# Patient Record
Sex: Female | Born: 1935 | Race: White | Hispanic: No | Marital: Married | Smoking: Never smoker
Health system: Southern US, Community
[De-identification: ages and names within clinical notes are randomized; demographics above are authoritative.]

## PROBLEM LIST (undated history)

## (undated) ENCOUNTER — Emergency Department (HOSPITAL_COMMUNITY): Admission: EM | Payer: Self-pay

## (undated) DIAGNOSIS — C801 Malignant (primary) neoplasm, unspecified: Secondary | ICD-10-CM

## (undated) DIAGNOSIS — I10 Essential (primary) hypertension: Secondary | ICD-10-CM

---

## 2015-08-18 ENCOUNTER — Encounter (HOSPITAL_COMMUNITY): Payer: Self-pay | Admitting: *Deleted

## 2015-08-18 ENCOUNTER — Emergency Department (HOSPITAL_COMMUNITY): Payer: Medicare Other

## 2015-08-18 ENCOUNTER — Observation Stay (HOSPITAL_COMMUNITY)
Admission: EM | Admit: 2015-08-18 | Discharge: 2015-08-21 | Disposition: A | Discharge status: Home / Self Care | Payer: Medicare Other | Attending: General Surgery | Admitting: General Surgery

## 2015-08-18 DIAGNOSIS — Z9012 Acquired absence of left breast and nipple: Secondary | ICD-10-CM | POA: Insufficient documentation

## 2015-08-18 DIAGNOSIS — S025XXA Fracture of tooth (traumatic), initial encounter for closed fracture: Secondary | ICD-10-CM | POA: Diagnosis not present

## 2015-08-18 DIAGNOSIS — R0902 Hypoxemia: Secondary | ICD-10-CM | POA: Diagnosis not present

## 2015-08-18 DIAGNOSIS — S42112A Displaced fracture of body of scapula, left shoulder, initial encounter for closed fracture: Secondary | ICD-10-CM | POA: Insufficient documentation

## 2015-08-18 DIAGNOSIS — S0003XA Contusion of scalp, initial encounter: Secondary | ICD-10-CM | POA: Diagnosis not present

## 2015-08-18 DIAGNOSIS — J939 Pneumothorax, unspecified: Secondary | ICD-10-CM

## 2015-08-18 DIAGNOSIS — S301XXA Contusion of abdominal wall, initial encounter: Secondary | ICD-10-CM | POA: Insufficient documentation

## 2015-08-18 DIAGNOSIS — W109XXA Fall (on) (from) unspecified stairs and steps, initial encounter: Secondary | ICD-10-CM | POA: Insufficient documentation

## 2015-08-18 DIAGNOSIS — N179 Acute kidney failure, unspecified: Secondary | ICD-10-CM | POA: Insufficient documentation

## 2015-08-18 DIAGNOSIS — S27321A Contusion of lung, unilateral, initial encounter: Secondary | ICD-10-CM | POA: Diagnosis not present

## 2015-08-18 DIAGNOSIS — S2249XA Multiple fractures of ribs, unspecified side, initial encounter for closed fracture: Secondary | ICD-10-CM | POA: Diagnosis present

## 2015-08-18 DIAGNOSIS — I10 Essential (primary) hypertension: Secondary | ICD-10-CM | POA: Diagnosis not present

## 2015-08-18 DIAGNOSIS — R531 Weakness: Secondary | ICD-10-CM | POA: Insufficient documentation

## 2015-08-18 DIAGNOSIS — S270XXA Traumatic pneumothorax, initial encounter: Secondary | ICD-10-CM | POA: Insufficient documentation

## 2015-08-18 DIAGNOSIS — Z7982 Long term (current) use of aspirin: Secondary | ICD-10-CM | POA: Diagnosis not present

## 2015-08-18 DIAGNOSIS — S42102A Fracture of unspecified part of scapula, left shoulder, initial encounter for closed fracture: Secondary | ICD-10-CM

## 2015-08-18 DIAGNOSIS — S2242XA Multiple fractures of ribs, left side, initial encounter for closed fracture: Principal | ICD-10-CM | POA: Insufficient documentation

## 2015-08-18 DIAGNOSIS — W19XXXA Unspecified fall, initial encounter: Secondary | ICD-10-CM

## 2015-08-18 DIAGNOSIS — S2232XA Fracture of one rib, left side, initial encounter for closed fracture: Secondary | ICD-10-CM

## 2015-08-18 DIAGNOSIS — S02401A Maxillary fracture, unspecified, initial encounter for closed fracture: Secondary | ICD-10-CM

## 2015-08-18 DIAGNOSIS — S22009A Unspecified fracture of unspecified thoracic vertebra, initial encounter for closed fracture: Secondary | ICD-10-CM

## 2015-08-18 HISTORY — DX: Malignant (primary) neoplasm, unspecified: C80.1

## 2015-08-18 HISTORY — DX: Essential (primary) hypertension: I10

## 2015-08-18 LAB — I-STAT CHEM 8, ED
BUN: 34 mg/dL — AB (ref 6–20)
CREATININE: 1.2 mg/dL — AB (ref 0.44–1.00)
Calcium, Ion: 1.08 mmol/L — ABNORMAL LOW (ref 1.13–1.30)
Chloride: 103 mmol/L (ref 101–111)
Glucose, Bld: 131 mg/dL — ABNORMAL HIGH (ref 65–99)
HEMATOCRIT: 38 % (ref 36.0–46.0)
Hemoglobin: 12.9 g/dL (ref 12.0–15.0)
POTASSIUM: 4.1 mmol/L (ref 3.5–5.1)
Sodium: 142 mmol/L (ref 135–145)
TCO2: 28 mmol/L (ref 0–100)

## 2015-08-18 LAB — CBC WITH DIFFERENTIAL/PLATELET
BASOS ABS: 0 10*3/uL (ref 0.0–0.1)
BASOS PCT: 0 %
Eosinophils Absolute: 0.2 10*3/uL (ref 0.0–0.7)
Eosinophils Relative: 1 %
HEMATOCRIT: 38.2 % (ref 36.0–46.0)
HEMOGLOBIN: 12.5 g/dL (ref 12.0–15.0)
Lymphocytes Relative: 23 %
Lymphs Abs: 3.7 10*3/uL (ref 0.7–4.0)
MCH: 30.8 pg (ref 26.0–34.0)
MCHC: 32.7 g/dL (ref 30.0–36.0)
MCV: 94.1 fL (ref 78.0–100.0)
Monocytes Absolute: 0.7 10*3/uL (ref 0.1–1.0)
Monocytes Relative: 5 %
NEUTROS ABS: 11.2 10*3/uL — AB (ref 1.7–7.7)
NEUTROS PCT: 71 %
Platelets: 205 10*3/uL (ref 150–400)
RBC: 4.06 MIL/uL (ref 3.87–5.11)
RDW: 13.8 % (ref 11.5–15.5)
WBC: 15.9 10*3/uL — ABNORMAL HIGH (ref 4.0–10.5)

## 2015-08-18 LAB — PROTIME-INR
INR: 1.06 (ref 0.00–1.49)
Prothrombin Time: 14 seconds (ref 11.6–15.2)

## 2015-08-18 MED ORDER — SIMVASTATIN 20 MG PO TABS
20.0000 mg | ORAL_TABLET | Freq: Every day | ORAL | Status: DC
  Administered 2015-08-18 – 2015-08-21 (×4): 20 mg via ORAL
  Filled 2015-08-18 (×4): qty 1

## 2015-08-18 MED ORDER — IOPAMIDOL (ISOVUE-300) INJECTION 61%
INTRAVENOUS | Status: AC
  Administered 2015-08-18: 100 mL
  Filled 2015-08-18: qty 100

## 2015-08-18 MED ORDER — FENTANYL CITRATE (PF) 100 MCG/2ML IJ SOLN
50.0000 ug | Freq: Once | INTRAMUSCULAR | Status: AC
  Administered 2015-08-18: 50 ug via INTRAVENOUS
  Filled 2015-08-18: qty 2

## 2015-08-18 MED ORDER — METHOCARBAMOL 500 MG PO TABS
500.0000 mg | ORAL_TABLET | Freq: Four times a day (QID) | ORAL | Status: DC
  Administered 2015-08-18: 500 mg via ORAL
  Filled 2015-08-18 (×3): qty 1

## 2015-08-18 MED ORDER — FUROSEMIDE 20 MG PO TABS
40.0000 mg | ORAL_TABLET | Freq: Every day | ORAL | Status: DC
  Administered 2015-08-18 – 2015-08-21 (×4): 40 mg via ORAL
  Filled 2015-08-18 (×4): qty 2

## 2015-08-18 MED ORDER — ENOXAPARIN SODIUM 30 MG/0.3ML ~~LOC~~ SOLN
30.0000 mg | SUBCUTANEOUS | Status: DC
  Administered 2015-08-18 – 2015-08-21 (×4): 30 mg via SUBCUTANEOUS
  Filled 2015-08-18 (×4): qty 0.3

## 2015-08-18 MED ORDER — PROMETHAZINE HCL 25 MG/ML IJ SOLN
12.5000 mg | Freq: Once | INTRAMUSCULAR | Status: AC
Start: 2015-08-18 — End: 2015-08-18
  Administered 2015-08-18: 12.5 mg via INTRAVENOUS
  Filled 2015-08-18: qty 1

## 2015-08-18 MED ORDER — ONDANSETRON HCL 4 MG PO TABS: 4.0000 mg | ORAL_TABLET | Freq: Four times a day (QID) | ORAL | Status: DC | PRN

## 2015-08-18 MED ORDER — ONDANSETRON HCL 4 MG/2ML IJ SOLN
4.0000 mg | Freq: Once | INTRAMUSCULAR | Status: AC
  Administered 2015-08-18: 4 mg via INTRAVENOUS
  Filled 2015-08-18: qty 2

## 2015-08-18 MED ORDER — HYDROMORPHONE HCL 1 MG/ML IJ SOLN
0.5000 mg | INTRAMUSCULAR | Status: DC | PRN
  Administered 2015-08-18 (×2): 1 mg via INTRAVENOUS
  Filled 2015-08-18 (×2): qty 1

## 2015-08-18 MED ORDER — KCL IN DEXTROSE-NACL 20-5-0.45 MEQ/L-%-% IV SOLN
INTRAVENOUS | Status: DC
  Administered 2015-08-18 – 2015-08-19 (×2): via INTRAVENOUS
  Administered 2015-08-19 – 2015-08-20 (×2): 100 mL/h via INTRAVENOUS
  Filled 2015-08-18 (×7): qty 1000

## 2015-08-18 MED ORDER — OXYCODONE HCL 5 MG PO TABS: 5.0000 mg | ORAL_TABLET | ORAL | Status: DC | PRN

## 2015-08-18 MED ORDER — DILTIAZEM HCL ER COATED BEADS 300 MG PO CP24
300.0000 mg | ORAL_CAPSULE | Freq: Every day | ORAL | Status: DC
  Administered 2015-08-18 – 2015-08-21 (×4): 300 mg via ORAL
  Filled 2015-08-18 (×4): qty 1

## 2015-08-18 MED ORDER — ONDANSETRON HCL 4 MG/2ML IJ SOLN
4.0000 mg | Freq: Four times a day (QID) | INTRAMUSCULAR | Status: DC | PRN
  Administered 2015-08-18 – 2015-08-21 (×3): 4 mg via INTRAVENOUS
  Filled 2015-08-18 (×3): qty 2

## 2015-08-18 MED ORDER — TRAMADOL HCL 50 MG PO TABS: 50.0000 mg | ORAL_TABLET | Freq: Four times a day (QID) | ORAL | Status: DC | PRN

## 2015-08-18 MED ORDER — OXYCODONE HCL 5 MG PO TABS
2.5000 mg | ORAL_TABLET | ORAL | Status: DC | PRN
  Administered 2015-08-18: 2.5 mg via ORAL
  Filled 2015-08-18: qty 1

## 2015-08-18 NOTE — Progress Notes (Signed)
Patient vomited small amount of dark black foul smelling emesis.  She was medicated with 4mg  IV Zofran at 1912.  She feels nausea is related to pain medicine which was given at approximately 1400. Her abdomen is soft;bowel sounds active.  VSS. Paged MD.

## 2015-08-18 NOTE — H&P (Signed)
History   Yvette Kelly is an 82 y.o. female.   Chief Complaint: [place] down 10 stairs, left chest wall pain.   Chief Complaint  Patient presents with  . Fall  . level 2     Fall This is a new problem. The current episode started yesterday. The problem occurs rarely. The problem has been unchanged. Associated symptoms include chest pain (left chest wall pain.). Pertinent negatives include no chills. The symptoms are aggravated by twisting, standing and walking. She has tried position changes for the symptoms. The treatment provided mild relief.    Past Medical History  Diagnosis Date  . Hypertension     History reviewed. No pertinent past surgical history.  No family history on file. Social History:  reports that she has never smoked. She does not have any smokeless tobacco history on file. She reports that she does not drink alcohol. Her drug history is not on file.  Allergies  No Known Allergies  Home Medications   (Not in a hospital admission)  Trauma Course   Results for orders placed or performed during the hospital encounter of 08/18/15 (from the past 48 hour(s))  CBC with Differential     Status: Abnormal   Collection Time: 08/18/15  1:45 AM  Result Value Ref Range   WBC 15.9 (H) 4.0 - 10.5 K/uL   RBC 4.06 3.87 - 5.11 MIL/uL   Hemoglobin 12.5 12.0 - 15.0 g/dL   HCT 38.2 36.0 - 46.0 %   MCV 94.1 78.0 - 100.0 fL   MCH 30.8 26.0 - 34.0 pg   MCHC 32.7 30.0 - 36.0 g/dL   RDW 13.8 11.5 - 15.5 %   Platelets 205 150 - 400 K/uL   Neutrophils Relative % 71 %   Neutro Abs 11.2 (H) 1.7 - 7.7 K/uL   Lymphocytes Relative 23 %   Lymphs Abs 3.7 0.7 - 4.0 K/uL   Monocytes Relative 5 %   Monocytes Absolute 0.7 0.1 - 1.0 K/uL   Eosinophils Relative 1 %   Eosinophils Absolute 0.2 0.0 - 0.7 K/uL   Basophils Relative 0 %   Basophils Absolute 0.0 0.0 - 0.1 K/uL  Protime-INR     Status: None   Collection Time: 08/18/15  1:45 AM  Result Value Ref Range   Prothrombin Time  14.0 11.6 - 15.2 seconds   INR 1.06 0.00 - 1.49  I-stat Chem 8, ED     Status: Abnormal   Collection Time: 08/18/15  2:02 AM  Result Value Ref Range   Sodium 142 135 - 145 mmol/L   Potassium 4.1 3.5 - 5.1 mmol/L   Chloride 103 101 - 111 mmol/L   BUN 34 (H) 6 - 20 mg/dL   Creatinine, Ser 1.20 (H) 0.44 - 1.00 mg/dL   Glucose, Bld 131 (H) 65 - 99 mg/dL   Calcium, Ion 1.08 (L) 1.13 - 1.30 mmol/L   TCO2 28 0 - 100 mmol/L   Hemoglobin 12.9 12.0 - 15.0 g/dL   HCT 38.0 36.0 - 46.0 %   Ct Head Wo Contrast  08/18/2015  CLINICAL DATA:  Level 2 trauma. Patient fell down 10 steps. Concern for head, maxillofacial or cervical spine injury. Initial encounter. EXAM: CT HEAD WITHOUT CONTRAST CT MAXILLOFACIAL WITHOUT CONTRAST CT CERVICAL SPINE WITHOUT CONTRAST TECHNIQUE: Multidetector CT imaging of the head, cervical spine, and maxillofacial structures were performed using the standard protocol without intravenous contrast. Multiplanar CT image reconstructions of the cervical spine and maxillofacial structures were also generated. COMPARISON:  None. FINDINGS: CT HEAD FINDINGS There is no evidence of acute infarction, mass lesion, or intra- or extra-axial hemorrhage on CT. Scattered periventricular and subcortical white matter change likely reflects small vessel ischemic microangiopathy. Mild cerebellar atrophy is noted. A tiny focus of increased attenuation at the high left posterior parietal lobe is thought to reflect a vessel. The brainstem and fourth ventricle are within normal limits. The third and lateral ventricles, and basal ganglia are unremarkable in appearance. The cerebral hemispheres are symmetric in appearance, with normal gray-white differentiation. No mass effect or midline shift is seen. There is no evidence of fracture; visualized osseous structures are unremarkable in appearance. The orbits are within normal limits. The paranasal sinuses and mastoid air cells are well-aerated. Soft tissue swelling is  noted overlying the left posterior parietal calvarium. CT MAXILLOFACIAL FINDINGS There appears to be a small fracture extending across the anterior aspect of the roots of the left central and lateral maxillary incisors. There is no additional evidence of fracture. The mandible appears intact. The nasal bone is unremarkable in appearance. Degenerative change is noted at the temporomandibular joints bilaterally, with flattening of the mandibular condylar heads. A periapical abscess is noted at the root of the right lateral maxillary incisor. The orbits are intact bilaterally. The visualized paranasal sinuses and mastoid air cells are well-aerated. No significant soft tissue abnormalities are seen. The parapharyngeal fat planes are preserved. The nasopharynx, oropharynx and hypopharynx are unremarkable in appearance. The visualized portions of the valleculae and piriform sinuses are grossly unremarkable. The parotid and submandibular glands are within normal limits. No cervical lymphadenopathy is seen. CT CERVICAL SPINE FINDINGS There appear to be nondisplaced fractures through the left transverse processes of T2 and T3. No additional fractures are seen. There is minimal grade 1 anterolisthesis of C4 on C5. Multilevel disc space narrowing is noted along the cervical spine, with scattered anterior and posterior disc osteophyte complexes, and underlying facet disease. Vertebral bodies demonstrate normal height. Prevertebral soft tissues are within normal limits. Tiny hypodensities within the thyroid gland are likely benign, given their size. Scarring is noted at the lung apices. A tiny left apical pneumothorax is noted. Mild scattered soft tissue air is seen posterior to the left lung apex. IMPRESSION: 1. No evidence of traumatic intracranial injury. 2. Small fracture extending across the anterior aspect of the roots of the left central and lateral maxillary incisors. 3. Nondisplaced fractures through the left transverse  processes of T2 and T3. 4. Soft tissue swelling overlying the left posterior parietal calvarium. 5. Scattered small vessel ischemic microangiopathy. 6. Degenerative change at the temporomandibular joints bilaterally, with flattening of the mandibular condylar heads. 7. Periapical abscess at the root of the right lateral maxillary incisor. 8. Degenerative change along the cervical spine. 9. Scarring at the lung apices. Tiny left apical pneumothorax noted. 10. Mild scattered soft tissue air posterior to the left lung apex. These results were called by telephone at the time of interpretation on 08/18/2015 at 3:46 am to Dr. Davonna Belling, who verbally acknowledged these results. Electronically Signed   By: Garald Balding M.D.   On: 08/18/2015 03:47   Ct Chest W Contrast  08/18/2015  CLINICAL DATA:  Level 2 trauma.  Fall down steps. Initial encounter. EXAM: CT CHEST, ABDOMEN, AND PELVIS WITH CONTRAST TECHNIQUE: Multidetector CT imaging of the chest, abdomen and pelvis was performed following the standard protocol during bolus administration of intravenous contrast. CONTRAST:  173mL ISOVUE-300 IOPAMIDOL (ISOVUE-300) INJECTION 61% COMPARISON:  None. FINDINGS: CT CHEST FINDINGS  THORACIC INLET/BODY WALL: Soft tissue gas posterior to the left upper chest and around the left shoulder girdle, presumably from pulmonic air leak as no laceration is seen. Left mastectomy.  No axillary adenopathy. Sub cm right thyroid nodule. MEDIASTINUM: Mild cardiomegaly. No pericardial effusion. No mediastinal hematoma or evidence of acute vascular injury. Small sliding hiatal hernia. LUNG WINDOWS: Small left pneumothorax, less than 5%. Minor lung contusion peripherally in the left lower lobe. Patchy bilateral atelectasis. When accounting for extrapleural hemorrhage there is no hemothorax. OSSEOUS: See below CT ABDOMEN AND PELVIS FINDINGS BODY WALL: Subcutaneous contusion around the left flank. Hepatobiliary: No focal liver abnormality.No  evidence of biliary obstruction or stone. Pancreas: Unremarkable. Spleen: Unremarkable. Adrenals/Urinary Tract: Negative adrenals. No evidence of renal injury. Unremarkable bladder. Reproductive:Abnormally thickened endometrium at 22 mm. Stomach/Bowel: No evidence of injury. Distal colonic diverticulosis. Vascular/Lymphatic: No acute vascular abnormality. No mass or adenopathy. Peritoneal: No ascites or pneumoperitoneum. Musculoskeletal: Comminuted fracture of the medial scapula on the left with multiple displaced and rotated fragments. Left second through seventh rib fractures with up to moderate displacement. There is extrapleural hemorrhage and gas. Chronic appearing T6 and T9 superior endplate fractures. T10 vertebral body hemangioma. T2 and T3 left transverse process fractures, nondisplaced and better seen on cervical spine CT - included for continuity. Lumbar predominant disc degeneration and facet arthropathy. Critical Value/emergent results were called by telephone at the time of interpretation on 08/18/2015 at 4:11 am to Dr. Davonna Belling , who verbally acknowledged these results. IMPRESSION: 1. Small left pneumothorax, less than 5%. Minor left lung contusion. 2. Left second through seventh rib fractures with up to moderate displacement. 3. T2 and T3 left transverse process fractures, nondisplaced. 4. Comminuted and displaced left medial scapula fracture. 5. Left flank contusion.  No evidence of intra-abdominal injury. 6. Abnormally thickened endometrium at 22 mm, possible carcinoma. Recommend follow-up pelvic ultrasound after convalescence. Electronically Signed   By: Monte Fantasia M.D.   On: 08/18/2015 04:12   Ct Cervical Spine Wo Contrast  08/18/2015  CLINICAL DATA:  Level 2 trauma. Patient fell down 10 steps. Concern for head, maxillofacial or cervical spine injury. Initial encounter. EXAM: CT HEAD WITHOUT CONTRAST CT MAXILLOFACIAL WITHOUT CONTRAST CT CERVICAL SPINE WITHOUT CONTRAST TECHNIQUE:  Multidetector CT imaging of the head, cervical spine, and maxillofacial structures were performed using the standard protocol without intravenous contrast. Multiplanar CT image reconstructions of the cervical spine and maxillofacial structures were also generated. COMPARISON:  None. FINDINGS: CT HEAD FINDINGS There is no evidence of acute infarction, mass lesion, or intra- or extra-axial hemorrhage on CT. Scattered periventricular and subcortical white matter change likely reflects small vessel ischemic microangiopathy. Mild cerebellar atrophy is noted. A tiny focus of increased attenuation at the high left posterior parietal lobe is thought to reflect a vessel. The brainstem and fourth ventricle are within normal limits. The third and lateral ventricles, and basal ganglia are unremarkable in appearance. The cerebral hemispheres are symmetric in appearance, with normal gray-white differentiation. No mass effect or midline shift is seen. There is no evidence of fracture; visualized osseous structures are unremarkable in appearance. The orbits are within normal limits. The paranasal sinuses and mastoid air cells are well-aerated. Soft tissue swelling is noted overlying the left posterior parietal calvarium. CT MAXILLOFACIAL FINDINGS There appears to be a small fracture extending across the anterior aspect of the roots of the left central and lateral maxillary incisors. There is no additional evidence of fracture. The mandible appears intact. The nasal bone is unremarkable in appearance.  Degenerative change is noted at the temporomandibular joints bilaterally, with flattening of the mandibular condylar heads. A periapical abscess is noted at the root of the right lateral maxillary incisor. The orbits are intact bilaterally. The visualized paranasal sinuses and mastoid air cells are well-aerated. No significant soft tissue abnormalities are seen. The parapharyngeal fat planes are preserved. The nasopharynx, oropharynx  and hypopharynx are unremarkable in appearance. The visualized portions of the valleculae and piriform sinuses are grossly unremarkable. The parotid and submandibular glands are within normal limits. No cervical lymphadenopathy is seen. CT CERVICAL SPINE FINDINGS There appear to be nondisplaced fractures through the left transverse processes of T2 and T3. No additional fractures are seen. There is minimal grade 1 anterolisthesis of C4 on C5. Multilevel disc space narrowing is noted along the cervical spine, with scattered anterior and posterior disc osteophyte complexes, and underlying facet disease. Vertebral bodies demonstrate normal height. Prevertebral soft tissues are within normal limits. Tiny hypodensities within the thyroid gland are likely benign, given their size. Scarring is noted at the lung apices. A tiny left apical pneumothorax is noted. Mild scattered soft tissue air is seen posterior to the left lung apex. IMPRESSION: 1. No evidence of traumatic intracranial injury. 2. Small fracture extending across the anterior aspect of the roots of the left central and lateral maxillary incisors. 3. Nondisplaced fractures through the left transverse processes of T2 and T3. 4. Soft tissue swelling overlying the left posterior parietal calvarium. 5. Scattered small vessel ischemic microangiopathy. 6. Degenerative change at the temporomandibular joints bilaterally, with flattening of the mandibular condylar heads. 7. Periapical abscess at the root of the right lateral maxillary incisor. 8. Degenerative change along the cervical spine. 9. Scarring at the lung apices. Tiny left apical pneumothorax noted. 10. Mild scattered soft tissue air posterior to the left lung apex. These results were called by telephone at the time of interpretation on 08/18/2015 at 3:46 am to Dr. Davonna Belling, who verbally acknowledged these results. Electronically Signed   By: Garald Balding M.D.   On: 08/18/2015 03:47   Ct Abdomen Pelvis  W Contrast  08/18/2015  CLINICAL DATA:  Level 2 trauma.  Fall down steps. Initial encounter. EXAM: CT CHEST, ABDOMEN, AND PELVIS WITH CONTRAST TECHNIQUE: Multidetector CT imaging of the chest, abdomen and pelvis was performed following the standard protocol during bolus administration of intravenous contrast. CONTRAST:  178mL ISOVUE-300 IOPAMIDOL (ISOVUE-300) INJECTION 61% COMPARISON:  None. FINDINGS: CT CHEST FINDINGS THORACIC INLET/BODY WALL: Soft tissue gas posterior to the left upper chest and around the left shoulder girdle, presumably from pulmonic air leak as no laceration is seen. Left mastectomy.  No axillary adenopathy. Sub cm right thyroid nodule. MEDIASTINUM: Mild cardiomegaly. No pericardial effusion. No mediastinal hematoma or evidence of acute vascular injury. Small sliding hiatal hernia. LUNG WINDOWS: Small left pneumothorax, less than 5%. Minor lung contusion peripherally in the left lower lobe. Patchy bilateral atelectasis. When accounting for extrapleural hemorrhage there is no hemothorax. OSSEOUS: See below CT ABDOMEN AND PELVIS FINDINGS BODY WALL: Subcutaneous contusion around the left flank. Hepatobiliary: No focal liver abnormality.No evidence of biliary obstruction or stone. Pancreas: Unremarkable. Spleen: Unremarkable. Adrenals/Urinary Tract: Negative adrenals. No evidence of renal injury. Unremarkable bladder. Reproductive:Abnormally thickened endometrium at 22 mm. Stomach/Bowel: No evidence of injury. Distal colonic diverticulosis. Vascular/Lymphatic: No acute vascular abnormality. No mass or adenopathy. Peritoneal: No ascites or pneumoperitoneum. Musculoskeletal: Comminuted fracture of the medial scapula on the left with multiple displaced and rotated fragments. Left second through seventh rib fractures with  up to moderate displacement. There is extrapleural hemorrhage and gas. Chronic appearing T6 and T9 superior endplate fractures. T10 vertebral body hemangioma. T2 and T3 left  transverse process fractures, nondisplaced and better seen on cervical spine CT - included for continuity. Lumbar predominant disc degeneration and facet arthropathy. Critical Value/emergent results were called by telephone at the time of interpretation on 08/18/2015 at 4:11 am to Dr. Davonna Belling , who verbally acknowledged these results. IMPRESSION: 1. Small left pneumothorax, less than 5%. Minor left lung contusion. 2. Left second through seventh rib fractures with up to moderate displacement. 3. T2 and T3 left transverse process fractures, nondisplaced. 4. Comminuted and displaced left medial scapula fracture. 5. Left flank contusion.  No evidence of intra-abdominal injury. 6. Abnormally thickened endometrium at 22 mm, possible carcinoma. Recommend follow-up pelvic ultrasound after convalescence. Electronically Signed   By: Monte Fantasia M.D.   On: 08/18/2015 04:12   Dg Pelvis Portable  08/18/2015  CLINICAL DATA:  Status post fall, with concern for pelvic injury. Initial encounter. EXAM: PORTABLE PELVIS 1-2 VIEWS COMPARISON:  None. FINDINGS: There is no evidence of fracture or dislocation. Both femoral heads are seated normally within their respective acetabula. Mild degenerative change is noted at the lower lumbar spine. The sacroiliac joints are unremarkable in appearance. The visualized bowel gas pattern is grossly unremarkable in appearance. IMPRESSION: No evidence of fracture or dislocation. Electronically Signed   By: Garald Balding M.D.   On: 08/18/2015 02:56   Dg Chest Portable 1 View  08/18/2015  CLINICAL DATA:  Status post fall, with concern for chest injury. Initial encounter. EXAM: PORTABLE CHEST 1 VIEW COMPARISON:  None. FINDINGS: The lungs are well-aerated. A small left pleural effusion is noted. Mild vascular congestion is seen. Mild bibasilar atelectasis is noted. There is no evidence of pleural effusion or pneumothorax. The cardiomediastinal silhouette is within normal limits. No acute  osseous abnormalities are seen. Scattered clips are seen overlying the left axilla. IMPRESSION: Small left pleural effusion noted. Mild vascular congestion seen. Mild bibasilar atelectasis noted. Electronically Signed   By: Garald Balding M.D.   On: 08/18/2015 02:56   Dg Shoulder Left  08/18/2015  CLINICAL DATA:  Status post fall, with left shoulder pain. Initial encounter. EXAM: LEFT SHOULDER - 2+ VIEW COMPARISON:  None. FINDINGS: There is a displaced and angulated fracture involving the body of the scapula. Overlying soft tissue air and soft tissue hemorrhage are suggested. There appears to be extension into the superior scapula, though this is not well characterized. There appears to be anterior subluxation of the left humeral head, with anterior narrowing of the glenohumeral joint, thought to reflect underlying ligamentous laxity and chronic degenerative change. No definite dislocation is seen. The proximal left humerus remains intact. The left acromioclavicular joint is grossly unremarkable. The visualized portions of lungs are grossly clear. IMPRESSION: 1. Displaced and angulated fracture involving the body of the scapula, with overlying soft tissue air and suggestion of soft tissue hemorrhage. There appears to be extension into the superior scapula, not fully characterized. 2. Anterior subluxation of the left humeral head, with anterior narrowing of the glenohumeral joint, thought to reflect underlying ligamentous laxity and chronic degenerative change. Electronically Signed   By: Garald Balding M.D.   On: 08/18/2015 02:59   Ct Maxillofacial Wo Cm  08/18/2015  CLINICAL DATA:  Level 2 trauma. Patient fell down 10 steps. Concern for head, maxillofacial or cervical spine injury. Initial encounter. EXAM: CT HEAD WITHOUT CONTRAST CT MAXILLOFACIAL WITHOUT CONTRAST CT  CERVICAL SPINE WITHOUT CONTRAST TECHNIQUE: Multidetector CT imaging of the head, cervical spine, and maxillofacial structures were performed using  the standard protocol without intravenous contrast. Multiplanar CT image reconstructions of the cervical spine and maxillofacial structures were also generated. COMPARISON:  None. FINDINGS: CT HEAD FINDINGS There is no evidence of acute infarction, mass lesion, or intra- or extra-axial hemorrhage on CT. Scattered periventricular and subcortical white matter change likely reflects small vessel ischemic microangiopathy. Mild cerebellar atrophy is noted. A tiny focus of increased attenuation at the high left posterior parietal lobe is thought to reflect a vessel. The brainstem and fourth ventricle are within normal limits. The third and lateral ventricles, and basal ganglia are unremarkable in appearance. The cerebral hemispheres are symmetric in appearance, with normal gray-white differentiation. No mass effect or midline shift is seen. There is no evidence of fracture; visualized osseous structures are unremarkable in appearance. The orbits are within normal limits. The paranasal sinuses and mastoid air cells are well-aerated. Soft tissue swelling is noted overlying the left posterior parietal calvarium. CT MAXILLOFACIAL FINDINGS There appears to be a small fracture extending across the anterior aspect of the roots of the left central and lateral maxillary incisors. There is no additional evidence of fracture. The mandible appears intact. The nasal bone is unremarkable in appearance. Degenerative change is noted at the temporomandibular joints bilaterally, with flattening of the mandibular condylar heads. A periapical abscess is noted at the root of the right lateral maxillary incisor. The orbits are intact bilaterally. The visualized paranasal sinuses and mastoid air cells are well-aerated. No significant soft tissue abnormalities are seen. The parapharyngeal fat planes are preserved. The nasopharynx, oropharynx and hypopharynx are unremarkable in appearance. The visualized portions of the valleculae and piriform  sinuses are grossly unremarkable. The parotid and submandibular glands are within normal limits. No cervical lymphadenopathy is seen. CT CERVICAL SPINE FINDINGS There appear to be nondisplaced fractures through the left transverse processes of T2 and T3. No additional fractures are seen. There is minimal grade 1 anterolisthesis of C4 on C5. Multilevel disc space narrowing is noted along the cervical spine, with scattered anterior and posterior disc osteophyte complexes, and underlying facet disease. Vertebral bodies demonstrate normal height. Prevertebral soft tissues are within normal limits. Tiny hypodensities within the thyroid gland are likely benign, given their size. Scarring is noted at the lung apices. A tiny left apical pneumothorax is noted. Mild scattered soft tissue air is seen posterior to the left lung apex. IMPRESSION: 1. No evidence of traumatic intracranial injury. 2. Small fracture extending across the anterior aspect of the roots of the left central and lateral maxillary incisors. 3. Nondisplaced fractures through the left transverse processes of T2 and T3. 4. Soft tissue swelling overlying the left posterior parietal calvarium. 5. Scattered small vessel ischemic microangiopathy. 6. Degenerative change at the temporomandibular joints bilaterally, with flattening of the mandibular condylar heads. 7. Periapical abscess at the root of the right lateral maxillary incisor. 8. Degenerative change along the cervical spine. 9. Scarring at the lung apices. Tiny left apical pneumothorax noted. 10. Mild scattered soft tissue air posterior to the left lung apex. These results were called by telephone at the time of interpretation on 08/18/2015 at 3:46 am to Dr. Davonna Belling, who verbally acknowledged these results. Electronically Signed   By: Garald Balding M.D.   On: 08/18/2015 03:47    Review of Systems  Constitutional: Negative for chills.  Cardiovascular: Positive for chest pain (left chest wall  pain.).  All other  systems reviewed and are negative.   Blood pressure 123/56, pulse 64, temperature 97.6 F (36.4 C), resp. rate 24, SpO2 95 %. Physical Exam  Nursing note and vitals reviewed. Constitutional: She is oriented to person, place, and time. She appears well-developed and well-nourished.  HENT:  Head: Normocephalic and atraumatic.  Mouth/Throat:    Eyes: Conjunctivae and EOM are normal. Pupils are equal, round, and reactive to light.  Cardiovascular: Normal rate and regular rhythm.   Respiratory: Effort normal and breath sounds normal. She exhibits tenderness.    GI: Soft. Bowel sounds are normal. There is no tenderness.  Musculoskeletal:       Left shoulder: She exhibits decreased range of motion, tenderness, bony tenderness and pain. She exhibits no swelling and no crepitus.  Neurological: She is alert and oriented to person, place, and time. She has normal reflexes.  Skin: Skin is warm and dry.  Psychiatric: She has a normal mood and affect. Her behavior is normal. Judgment and thought content normal.     Assessment/Plan Fall down stairs with the following injuries:  1. Left rib fractures x 6, 2-7,; 2.  Left pulmonary contusion; 3.  Left scapular fracture; 4.  Broken teeth and dislodgement; 5.  Small CT diagnosed PTX;  Will admit for pain control, PT/OT.  Repeat labs and X-rays.  IS.  Needs to go to the floor only.  Kinda Pottle 08/18/2015, 8:13 AM   Procedures

## 2015-08-18 NOTE — ED Provider Notes (Signed)
CSN: [id number]     Arrival date & time 08/18/15  0138 History  By signing my name below, I, Jasmyn B. Alexander, attest that this documentation has been prepared under the direction and in the presence of Davonna Belling, MD.  Electronically Signed: Tedra Coupe. Sheppard Coil, ED Scribe. 08/18/2015. 1:54 AM.   Chief Complaint  Patient presents with  . Fall  . level 2    The history is provided by the patient. No language interpreter was used.    HPI Comments: Faiza Kurzawa, brought in by EMS, is a 82 y.o. female who presents to the Emergency Department complaining of left shoulder pain radiating down left side with back pain s/p fall. Per EMS, they report patient fell down approximately 10 stairs. LOC is unknown. EMS notes she has large hematoma on the back of head. Her blood pressure in route was 144/70. She was not in atrial fibrillation. Pt is unable to describe what happened during incident. She claims that she is from [place], [place] visiting her son that lives in [place]. Per pt's son, they are actually in [place] visiting her granddaughter. She says that she has not had any left shoulder pain in the past.  Denies any chest pain or SOB.   Past Medical History  Diagnosis Date  . Hypertension    History reviewed. No pertinent past surgical history. No family history on file. Social History  Substance Use Topics  . Smoking status: Never Smoker   . Smokeless tobacco: None  . Alcohol Use: No   OB History    No data available     Review of Systems  Respiratory: Negative for shortness of breath.   Cardiovascular: Negative for chest pain.  Musculoskeletal: Positive for myalgias, back pain and arthralgias.  Psychiatric/Behavioral: Positive for confusion.  All other systems reviewed and are negative.   Allergies  Review of patient's allergies indicates no known allergies.  Home Medications   Prior to Admission medications   Medication Sig Start Date End Date Taking? Authorizing Provider   aspirin EC 81 MG tablet Take 81 mg by mouth daily.   Yes Historical Provider, MD  diltiazem (TIAZAC) 300 MG 24 hr capsule Take 300 mg by mouth daily.   Yes Historical Provider, MD  furosemide (LASIX) 40 MG tablet Take 40 mg by mouth daily.   Yes Historical Provider, MD  simvastatin (ZOCOR) 20 MG tablet Take 20 mg by mouth daily.   Yes Historical Provider, MD   BP 123/56 mmHg  Pulse 64  Temp(Src) 97.6 F (36.4 C)  Resp 24  SpO2 95% Physical Exam  Constitutional: She is oriented to person, place, and time. She appears well-developed and well-nourished. No distress.  Mildly confused  HENT:  Head: Normocephalic and atraumatic.  Hematoma of left occipital area. Blood in mouth with broken off left upper 2nd from midline tooth. Face non-tender..   Eyes: Conjunctivae are normal.  Neck: Normal range of motion.  Good ROM.  Cardiovascular: Normal rate.   Pulmonary/Chest: Effort normal.  Abdominal: She exhibits no distension. There is no tenderness.   No CVA tenderness. Abdomen nontender  Musculoskeletal: She exhibits tenderness.  Tenderness of left upper back, scapula area.Tenderness of left shoulder. No tenderness of lower extremities.   Neurological: She is alert and oriented to person, place, and time.  Skin: Skin is warm and dry.  Ecchymosis of left shoulder.  Psychiatric: She has a normal mood and affect.  Nursing note and vitals reviewed.   ED Course  Procedures (including critical  care time)  COORDINATION OF CARE: 1:55 AM-Discussed treatment plan which includes CXR, X-ray of Pelvis, CBC, and EKG with pt at bedside and pt agreed to plan.   Labs Review Labs Reviewed  CBC WITH DIFFERENTIAL/PLATELET - Abnormal; Notable for the following:    WBC 15.9 (*)    Neutro Abs 11.2 (*)    All other components within normal limits  I-STAT CHEM 8, ED - Abnormal; Notable for the following:    BUN 34 (*)    Creatinine, Ser 1.20 (*)    Glucose, Bld 131 (*)    Calcium, Ion 1.08 (*)    All  other components within normal limits  PROTIME-INR    Imaging Review Ct Head Wo Contrast  08/18/2015  CLINICAL DATA:  Level 2 trauma. Patient fell down 10 steps. Concern for head, maxillofacial or cervical spine injury. Initial encounter. EXAM: CT HEAD WITHOUT CONTRAST CT MAXILLOFACIAL WITHOUT CONTRAST CT CERVICAL SPINE WITHOUT CONTRAST TECHNIQUE: Multidetector CT imaging of the head, cervical spine, and maxillofacial structures were performed using the standard protocol without intravenous contrast. Multiplanar CT image reconstructions of the cervical spine and maxillofacial structures were also generated. COMPARISON:  None. FINDINGS: CT HEAD FINDINGS There is no evidence of acute infarction, mass lesion, or intra- or extra-axial hemorrhage on CT. Scattered periventricular and subcortical white matter change likely reflects small vessel ischemic microangiopathy. Mild cerebellar atrophy is noted. A tiny focus of increased attenuation at the high left posterior parietal lobe is thought to reflect a vessel. The brainstem and fourth ventricle are within normal limits. The third and lateral ventricles, and basal ganglia are unremarkable in appearance. The cerebral hemispheres are symmetric in appearance, with normal gray-white differentiation. No mass effect or midline shift is seen. There is no evidence of fracture; visualized osseous structures are unremarkable in appearance. The orbits are within normal limits. The paranasal sinuses and mastoid air cells are well-aerated. Soft tissue swelling is noted overlying the left posterior parietal calvarium. CT MAXILLOFACIAL FINDINGS There appears to be a small fracture extending across the anterior aspect of the roots of the left central and lateral maxillary incisors. There is no additional evidence of fracture. The mandible appears intact. The nasal bone is unremarkable in appearance. Degenerative change is noted at the temporomandibular joints bilaterally, with  flattening of the mandibular condylar heads. A periapical abscess is noted at the root of the right lateral maxillary incisor. The orbits are intact bilaterally. The visualized paranasal sinuses and mastoid air cells are well-aerated. No significant soft tissue abnormalities are seen. The parapharyngeal fat planes are preserved. The nasopharynx, oropharynx and hypopharynx are unremarkable in appearance. The visualized portions of the valleculae and piriform sinuses are grossly unremarkable. The parotid and submandibular glands are within normal limits. No cervical lymphadenopathy is seen. CT CERVICAL SPINE FINDINGS There appear to be nondisplaced fractures through the left transverse processes of T2 and T3. No additional fractures are seen. There is minimal grade 1 anterolisthesis of C4 on C5. Multilevel disc space narrowing is noted along the cervical spine, with scattered anterior and posterior disc osteophyte complexes, and underlying facet disease. Vertebral bodies demonstrate normal height. Prevertebral soft tissues are within normal limits. Tiny hypodensities within the thyroid gland are likely benign, given their size. Scarring is noted at the lung apices. A tiny left apical pneumothorax is noted. Mild scattered soft tissue air is seen posterior to the left lung apex. IMPRESSION: 1. No evidence of traumatic intracranial injury. 2. Small fracture extending across the anterior aspect of the  roots of the left central and lateral maxillary incisors. 3. Nondisplaced fractures through the left transverse processes of T2 and T3. 4. Soft tissue swelling overlying the left posterior parietal calvarium. 5. Scattered small vessel ischemic microangiopathy. 6. Degenerative change at the temporomandibular joints bilaterally, with flattening of the mandibular condylar heads. 7. Periapical abscess at the root of the right lateral maxillary incisor. 8. Degenerative change along the cervical spine. 9. Scarring at the lung  apices. Tiny left apical pneumothorax noted. 10. Mild scattered soft tissue air posterior to the left lung apex. These results were called by telephone at the time of interpretation on 08/18/2015 at 3:46 am to Dr. Davonna Belling, who verbally acknowledged these results. Electronically Signed   By: Garald Balding M.D.   On: 08/18/2015 03:47   Ct Chest W Contrast  08/18/2015  CLINICAL DATA:  Level 2 trauma.  Fall down steps. Initial encounter. EXAM: CT CHEST, ABDOMEN, AND PELVIS WITH CONTRAST TECHNIQUE: Multidetector CT imaging of the chest, abdomen and pelvis was performed following the standard protocol during bolus administration of intravenous contrast. CONTRAST:  157mL ISOVUE-300 IOPAMIDOL (ISOVUE-300) INJECTION 61% COMPARISON:  None. FINDINGS: CT CHEST FINDINGS THORACIC INLET/BODY WALL: Soft tissue gas posterior to the left upper chest and around the left shoulder girdle, presumably from pulmonic air leak as no laceration is seen. Left mastectomy.  No axillary adenopathy. Sub cm right thyroid nodule. MEDIASTINUM: Mild cardiomegaly. No pericardial effusion. No mediastinal hematoma or evidence of acute vascular injury. Small sliding hiatal hernia. LUNG WINDOWS: Small left pneumothorax, less than 5%. Minor lung contusion peripherally in the left lower lobe. Patchy bilateral atelectasis. When accounting for extrapleural hemorrhage there is no hemothorax. OSSEOUS: See below CT ABDOMEN AND PELVIS FINDINGS BODY WALL: Subcutaneous contusion around the left flank. Hepatobiliary: No focal liver abnormality.No evidence of biliary obstruction or stone. Pancreas: Unremarkable. Spleen: Unremarkable. Adrenals/Urinary Tract: Negative adrenals. No evidence of renal injury. Unremarkable bladder. Reproductive:Abnormally thickened endometrium at 22 mm. Stomach/Bowel: No evidence of injury. Distal colonic diverticulosis. Vascular/Lymphatic: No acute vascular abnormality. No mass or adenopathy. Peritoneal: No ascites or  pneumoperitoneum. Musculoskeletal: Comminuted fracture of the medial scapula on the left with multiple displaced and rotated fragments. Left second through seventh rib fractures with up to moderate displacement. There is extrapleural hemorrhage and gas. Chronic appearing T6 and T9 superior endplate fractures. T10 vertebral body hemangioma. T2 and T3 left transverse process fractures, nondisplaced and better seen on cervical spine CT - included for continuity. Lumbar predominant disc degeneration and facet arthropathy. Critical Value/emergent results were called by telephone at the time of interpretation on 08/18/2015 at 4:11 am to Dr. Davonna Belling , who verbally acknowledged these results. IMPRESSION: 1. Small left pneumothorax, less than 5%. Minor left lung contusion. 2. Left second through seventh rib fractures with up to moderate displacement. 3. T2 and T3 left transverse process fractures, nondisplaced. 4. Comminuted and displaced left medial scapula fracture. 5. Left flank contusion.  No evidence of intra-abdominal injury. 6. Abnormally thickened endometrium at 22 mm, possible carcinoma. Recommend follow-up pelvic ultrasound after convalescence. Electronically Signed   By: Monte Fantasia M.D.   On: 08/18/2015 04:12   Ct Cervical Spine Wo Contrast  08/18/2015  CLINICAL DATA:  Level 2 trauma. Patient fell down 10 steps. Concern for head, maxillofacial or cervical spine injury. Initial encounter. EXAM: CT HEAD WITHOUT CONTRAST CT MAXILLOFACIAL WITHOUT CONTRAST CT CERVICAL SPINE WITHOUT CONTRAST TECHNIQUE: Multidetector CT imaging of the head, cervical spine, and maxillofacial structures were performed using the standard protocol without  intravenous contrast. Multiplanar CT image reconstructions of the cervical spine and maxillofacial structures were also generated. COMPARISON:  None. FINDINGS: CT HEAD FINDINGS There is no evidence of acute infarction, mass lesion, or intra- or extra-axial hemorrhage on CT.  Scattered periventricular and subcortical white matter change likely reflects small vessel ischemic microangiopathy. Mild cerebellar atrophy is noted. A tiny focus of increased attenuation at the high left posterior parietal lobe is thought to reflect a vessel. The brainstem and fourth ventricle are within normal limits. The third and lateral ventricles, and basal ganglia are unremarkable in appearance. The cerebral hemispheres are symmetric in appearance, with normal gray-white differentiation. No mass effect or midline shift is seen. There is no evidence of fracture; visualized osseous structures are unremarkable in appearance. The orbits are within normal limits. The paranasal sinuses and mastoid air cells are well-aerated. Soft tissue swelling is noted overlying the left posterior parietal calvarium. CT MAXILLOFACIAL FINDINGS There appears to be a small fracture extending across the anterior aspect of the roots of the left central and lateral maxillary incisors. There is no additional evidence of fracture. The mandible appears intact. The nasal bone is unremarkable in appearance. Degenerative change is noted at the temporomandibular joints bilaterally, with flattening of the mandibular condylar heads. A periapical abscess is noted at the root of the right lateral maxillary incisor. The orbits are intact bilaterally. The visualized paranasal sinuses and mastoid air cells are well-aerated. No significant soft tissue abnormalities are seen. The parapharyngeal fat planes are preserved. The nasopharynx, oropharynx and hypopharynx are unremarkable in appearance. The visualized portions of the valleculae and piriform sinuses are grossly unremarkable. The parotid and submandibular glands are within normal limits. No cervical lymphadenopathy is seen. CT CERVICAL SPINE FINDINGS There appear to be nondisplaced fractures through the left transverse processes of T2 and T3. No additional fractures are seen. There is minimal  grade 1 anterolisthesis of C4 on C5. Multilevel disc space narrowing is noted along the cervical spine, with scattered anterior and posterior disc osteophyte complexes, and underlying facet disease. Vertebral bodies demonstrate normal height. Prevertebral soft tissues are within normal limits. Tiny hypodensities within the thyroid gland are likely benign, given their size. Scarring is noted at the lung apices. A tiny left apical pneumothorax is noted. Mild scattered soft tissue air is seen posterior to the left lung apex. IMPRESSION: 1. No evidence of traumatic intracranial injury. 2. Small fracture extending across the anterior aspect of the roots of the left central and lateral maxillary incisors. 3. Nondisplaced fractures through the left transverse processes of T2 and T3. 4. Soft tissue swelling overlying the left posterior parietal calvarium. 5. Scattered small vessel ischemic microangiopathy. 6. Degenerative change at the temporomandibular joints bilaterally, with flattening of the mandibular condylar heads. 7. Periapical abscess at the root of the right lateral maxillary incisor. 8. Degenerative change along the cervical spine. 9. Scarring at the lung apices. Tiny left apical pneumothorax noted. 10. Mild scattered soft tissue air posterior to the left lung apex. These results were called by telephone at the time of interpretation on 08/18/2015 at 3:46 am to Dr. Davonna Belling, who verbally acknowledged these results. Electronically Signed   By: Garald Balding M.D.   On: 08/18/2015 03:47   Ct Abdomen Pelvis W Contrast  08/18/2015  CLINICAL DATA:  Level 2 trauma.  Fall down steps. Initial encounter. EXAM: CT CHEST, ABDOMEN, AND PELVIS WITH CONTRAST TECHNIQUE: Multidetector CT imaging of the chest, abdomen and pelvis was performed following the standard protocol during bolus  administration of intravenous contrast. CONTRAST:  172mL ISOVUE-300 IOPAMIDOL (ISOVUE-300) INJECTION 61% COMPARISON:  None. FINDINGS: CT  CHEST FINDINGS THORACIC INLET/BODY WALL: Soft tissue gas posterior to the left upper chest and around the left shoulder girdle, presumably from pulmonic air leak as no laceration is seen. Left mastectomy.  No axillary adenopathy. Sub cm right thyroid nodule. MEDIASTINUM: Mild cardiomegaly. No pericardial effusion. No mediastinal hematoma or evidence of acute vascular injury. Small sliding hiatal hernia. LUNG WINDOWS: Small left pneumothorax, less than 5%. Minor lung contusion peripherally in the left lower lobe. Patchy bilateral atelectasis. When accounting for extrapleural hemorrhage there is no hemothorax. OSSEOUS: See below CT ABDOMEN AND PELVIS FINDINGS BODY WALL: Subcutaneous contusion around the left flank. Hepatobiliary: No focal liver abnormality.No evidence of biliary obstruction or stone. Pancreas: Unremarkable. Spleen: Unremarkable. Adrenals/Urinary Tract: Negative adrenals. No evidence of renal injury. Unremarkable bladder. Reproductive:Abnormally thickened endometrium at 22 mm. Stomach/Bowel: No evidence of injury. Distal colonic diverticulosis. Vascular/Lymphatic: No acute vascular abnormality. No mass or adenopathy. Peritoneal: No ascites or pneumoperitoneum. Musculoskeletal: Comminuted fracture of the medial scapula on the left with multiple displaced and rotated fragments. Left second through seventh rib fractures with up to moderate displacement. There is extrapleural hemorrhage and gas. Chronic appearing T6 and T9 superior endplate fractures. T10 vertebral body hemangioma. T2 and T3 left transverse process fractures, nondisplaced and better seen on cervical spine CT - included for continuity. Lumbar predominant disc degeneration and facet arthropathy. Critical Value/emergent results were called by telephone at the time of interpretation on 08/18/2015 at 4:11 am to Dr. Davonna Belling , who verbally acknowledged these results. IMPRESSION: 1. Small left pneumothorax, less than 5%. Minor left lung  contusion. 2. Left second through seventh rib fractures with up to moderate displacement. 3. T2 and T3 left transverse process fractures, nondisplaced. 4. Comminuted and displaced left medial scapula fracture. 5. Left flank contusion.  No evidence of intra-abdominal injury. 6. Abnormally thickened endometrium at 22 mm, possible carcinoma. Recommend follow-up pelvic ultrasound after convalescence. Electronically Signed   By: Monte Fantasia M.D.   On: 08/18/2015 04:12   Dg Pelvis Portable  08/18/2015  CLINICAL DATA:  Status post fall, with concern for pelvic injury. Initial encounter. EXAM: PORTABLE PELVIS 1-2 VIEWS COMPARISON:  None. FINDINGS: There is no evidence of fracture or dislocation. Both femoral heads are seated normally within their respective acetabula. Mild degenerative change is noted at the lower lumbar spine. The sacroiliac joints are unremarkable in appearance. The visualized bowel gas pattern is grossly unremarkable in appearance. IMPRESSION: No evidence of fracture or dislocation. Electronically Signed   By: Garald Balding M.D.   On: 08/18/2015 02:56   Dg Chest Portable 1 View  08/18/2015  CLINICAL DATA:  Status post fall, with concern for chest injury. Initial encounter. EXAM: PORTABLE CHEST 1 VIEW COMPARISON:  None. FINDINGS: The lungs are well-aerated. A small left pleural effusion is noted. Mild vascular congestion is seen. Mild bibasilar atelectasis is noted. There is no evidence of pleural effusion or pneumothorax. The cardiomediastinal silhouette is within normal limits. No acute osseous abnormalities are seen. Scattered clips are seen overlying the left axilla. IMPRESSION: Small left pleural effusion noted. Mild vascular congestion seen. Mild bibasilar atelectasis noted. Electronically Signed   By: Garald Balding M.D.   On: 08/18/2015 02:56   Dg Shoulder Left  08/18/2015  CLINICAL DATA:  Status post fall, with left shoulder pain. Initial encounter. EXAM: LEFT SHOULDER - 2+ VIEW  COMPARISON:  None. FINDINGS: There is a displaced and angulated  fracture involving the body of the scapula. Overlying soft tissue air and soft tissue hemorrhage are suggested. There appears to be extension into the superior scapula, though this is not well characterized. There appears to be anterior subluxation of the left humeral head, with anterior narrowing of the glenohumeral joint, thought to reflect underlying ligamentous laxity and chronic degenerative change. No definite dislocation is seen. The proximal left humerus remains intact. The left acromioclavicular joint is grossly unremarkable. The visualized portions of lungs are grossly clear. IMPRESSION: 1. Displaced and angulated fracture involving the body of the scapula, with overlying soft tissue air and suggestion of soft tissue hemorrhage. There appears to be extension into the superior scapula, not fully characterized. 2. Anterior subluxation of the left humeral head, with anterior narrowing of the glenohumeral joint, thought to reflect underlying ligamentous laxity and chronic degenerative change. Electronically Signed   By: Garald Balding M.D.   On: 08/18/2015 02:59   Ct Maxillofacial Wo Cm  08/18/2015  CLINICAL DATA:  Level 2 trauma. Patient fell down 10 steps. Concern for head, maxillofacial or cervical spine injury. Initial encounter. EXAM: CT HEAD WITHOUT CONTRAST CT MAXILLOFACIAL WITHOUT CONTRAST CT CERVICAL SPINE WITHOUT CONTRAST TECHNIQUE: Multidetector CT imaging of the head, cervical spine, and maxillofacial structures were performed using the standard protocol without intravenous contrast. Multiplanar CT image reconstructions of the cervical spine and maxillofacial structures were also generated. COMPARISON:  None. FINDINGS: CT HEAD FINDINGS There is no evidence of acute infarction, mass lesion, or intra- or extra-axial hemorrhage on CT. Scattered periventricular and subcortical white matter change likely reflects small vessel ischemic  microangiopathy. Mild cerebellar atrophy is noted. A tiny focus of increased attenuation at the high left posterior parietal lobe is thought to reflect a vessel. The brainstem and fourth ventricle are within normal limits. The third and lateral ventricles, and basal ganglia are unremarkable in appearance. The cerebral hemispheres are symmetric in appearance, with normal gray-white differentiation. No mass effect or midline shift is seen. There is no evidence of fracture; visualized osseous structures are unremarkable in appearance. The orbits are within normal limits. The paranasal sinuses and mastoid air cells are well-aerated. Soft tissue swelling is noted overlying the left posterior parietal calvarium. CT MAXILLOFACIAL FINDINGS There appears to be a small fracture extending across the anterior aspect of the roots of the left central and lateral maxillary incisors. There is no additional evidence of fracture. The mandible appears intact. The nasal bone is unremarkable in appearance. Degenerative change is noted at the temporomandibular joints bilaterally, with flattening of the mandibular condylar heads. A periapical abscess is noted at the root of the right lateral maxillary incisor. The orbits are intact bilaterally. The visualized paranasal sinuses and mastoid air cells are well-aerated. No significant soft tissue abnormalities are seen. The parapharyngeal fat planes are preserved. The nasopharynx, oropharynx and hypopharynx are unremarkable in appearance. The visualized portions of the valleculae and piriform sinuses are grossly unremarkable. The parotid and submandibular glands are within normal limits. No cervical lymphadenopathy is seen. CT CERVICAL SPINE FINDINGS There appear to be nondisplaced fractures through the left transverse processes of T2 and T3. No additional fractures are seen. There is minimal grade 1 anterolisthesis of C4 on C5. Multilevel disc space narrowing is noted along the cervical  spine, with scattered anterior and posterior disc osteophyte complexes, and underlying facet disease. Vertebral bodies demonstrate normal height. Prevertebral soft tissues are within normal limits. Tiny hypodensities within the thyroid gland are likely benign, given their size. Scarring is noted  at the lung apices. A tiny left apical pneumothorax is noted. Mild scattered soft tissue air is seen posterior to the left lung apex. IMPRESSION: 1. No evidence of traumatic intracranial injury. 2. Small fracture extending across the anterior aspect of the roots of the left central and lateral maxillary incisors. 3. Nondisplaced fractures through the left transverse processes of T2 and T3. 4. Soft tissue swelling overlying the left posterior parietal calvarium. 5. Scattered small vessel ischemic microangiopathy. 6. Degenerative change at the temporomandibular joints bilaterally, with flattening of the mandibular condylar heads. 7. Periapical abscess at the root of the right lateral maxillary incisor. 8. Degenerative change along the cervical spine. 9. Scarring at the lung apices. Tiny left apical pneumothorax noted. 10. Mild scattered soft tissue air posterior to the left lung apex. These results were called by telephone at the time of interpretation on 08/18/2015 at 3:46 am to Dr. Davonna Belling, who verbally acknowledged these results. Electronically Signed   By: Garald Balding M.D.   On: 08/18/2015 03:47   I have personally reviewed and evaluated these images and lab results as part of my medical decision-making.   EKG Interpretation   Date/Time:  Sunday August 18 2015 02:09:28 EDT Ventricular Rate:  68 PR Interval:  162 QRS Duration: 139 QT Interval:  460 QTC Calculation: 489 R Axis:   -42 Text Interpretation:  Sinus rhythm Probable left atrial enlargement Left  bundle branch block Confirmed by Alvino Chapel  MD, Ovid Curd [id number]) on 08/18/2015  2:13:24 AM      MDM   Final diagnoses:  Fall, initial encounter   Rib fractures, left, closed, initial encounter  Fracture of thoracic transverse process, closed, initial encounter ([place])  Pneumothorax  Scapula fracture, left, closed, initial encounter  Closed fracture of maxilla, initial encounter [facility])    Patient with fall downstairs. Hematoma to scalp and 6 rib fractures, small pneumothorax, 2 transverse process fractures and some dental/maxillary fracture. Discussed with Dr.Tsuie and patient will be admitted to the trauma service.   I personally performed the services described in this documentation, which was scribed in my presence. The recorded information has been reviewed and is accurate.   CRITICAL CARE Performed by: Mackie Pai Total critical care time: 30 minutes Critical care time was exclusive of separately billable procedures and treating other patients. Critical care was necessary to treat or prevent imminent or life-threatening deterioration. Critical care was time spent personally by me on the following activities: development of treatment plan with patient and/or surrogate as well as nursing, discussions with consultants, evaluation of patient's response to treatment, examination of patient, obtaining history from patient or surrogate, ordering and performing treatments and interventions, ordering and review of laboratory studies, ordering and review of radiographic studies, pulse oximetry and re-evaluation of patient's condition.     Davonna Belling, MD 08/18/15 [phone]

## 2015-08-18 NOTE — Progress Notes (Signed)
Orthopedic Tech Progress Note Patient Details:  Yvette Kelly December 17, 1925 [id number]  Ortho Devices Type of Ortho Device: Arm sling Ortho Device/Splint Location: lue Ortho Device/Splint Interventions: Ordered, Application   Karolee Stamps 08/18/2015, 6:08 AM

## 2015-08-18 NOTE — ED Notes (Signed)
Taken to radiology department at this time.

## 2015-08-18 NOTE — ED Notes (Signed)
Wyatt MD at bedside. 

## 2015-08-18 NOTE — Progress Notes (Signed)
   08/18/15 0200  Clinical Encounter Type  Visited With Family  Visit Type ED  Referral From Nurse  Consult/Referral To Chaplain  Spiritual Encounters  Spiritual Needs Emotional  [facility] responded to Level 2 trauma. Spoke with son of patient and informed son's wife in waiting area of status. Roe Coombs 08/18/2015

## 2015-08-18 NOTE — ED Notes (Signed)
The pt arrived by gems from  Granddaughters home where she is visiting from [place].  She fell down approx 10 steps    No loc  She has pain to her lt shoulder and beneath.  Upper lateral incisor knocked out small amount of bleeding from her mouth.  Initially she was confused  Now she is alert and oriented x 4

## 2015-08-18 NOTE — Evaluation (Addendum)
Physical Therapy Evaluation Patient Details Name: Yvette Kelly MRN: [id number] DOB: 1925/11/07 Today's Date: 08/18/2015   History of Present Illness  Pt is a 82 y/o F from [place] visiting her granddaughter in [place] when she got up at night to use the restroom and fell down a flight of steps.  Pt now w/ resultant Lt scapular fx, Lt 2-7 rib fxs, and broken teeth.     Clinical Impression  Pt admitted with above diagnosis. Pt currently with functional limitations due to the deficits listed below (see PT Problem List). Session limited due to pt nausea and +emesis.  She is at min guard level for rolling in bed.  She will be staying w/ her granddaughter temporarily at d/c and will then return to [place] where she lives and where she is able to stay w/ her son if needed.  PTA pt Ind w/ all mobility.  Anticipate that once pt's nausea controlled she will progress well w/ mobility. Pt will benefit from skilled PT to increase their independence and safety with mobility to allow discharge to the venue listed below.      Follow Up Recommendations Home health PT;Supervision for mobility/OOB    Equipment Recommendations  Other (comment) (TBD as pt progresses)    Recommendations for Other Services OT consult     Precautions / Restrictions Precautions Precautions: Fall Required Braces or Orthoses: Sling Restrictions Weight Bearing Restrictions: Yes LUE Weight Bearing: Non weight bearing      Mobility  Bed Mobility Overal bed mobility: Needs Assistance Bed Mobility: Rolling Rolling: Min guard         General bed mobility comments: Cues for technique to roll to Rt side for positioning of bed pan  Transfers                 General transfer comment: Unable to attempt due to pt's nause and +emesis  Ambulation/Gait                Stairs            Wheelchair Mobility    Modified Rankin (Stroke Patients Only)       Balance                                              Pertinent Vitals/Pain Pain Assessment: Faces Faces Pain Scale: Hurts even more Pain Location: back/Lt shoulder Pain Descriptors / Indicators: Aching;Discomfort Pain Intervention(s): Limited activity within patient's tolerance;Monitored during session;Premedicated before session    Home Living Family/patient expects to be discharged to:: Private residence Living Arrangements: Other (Comment) (granddaughter) Available Help at Discharge: Family;Available PRN/intermittently Type of Home: House Home Access: Level entry     Home Layout: Two level Home Equipment: Walker - 2 wheels;Cane - single point;Wheelchair - manual (this equipment at home in [place]) Additional Comments: Pt lives alone in [place].  She is [place] staying w/ her granddaughter which is where she will stay until going home.  When she goes to [place] she says she will be able to stay w/ her son.  Information above is in regards to her granddaughters home    Prior Function Level of Independence: Independent         Comments: Not using AD to ambulate.  Ind w/ bathing, dressing. At home has a walk in shower.     Hand Dominance  Extremity/Trunk Assessment   Upper Extremity Assessment: Defer to OT evaluation           Lower Extremity Assessment: Overall WFL for tasks assessed         Communication   Communication: No difficulties  Cognition Arousal/Alertness: Lethargic;Suspect due to medications Behavior During Therapy: Flat affect Overall Cognitive Status: Within Functional Limits for tasks assessed                      General Comments General comments (skin integrity, edema, etc.): Pt w/ +emesis lying supine in bed, RN made aware.     Exercises        Assessment/Plan    PT Assessment Patient needs continued PT services  PT Diagnosis Acute pain   PT Problem List Decreased strength;Decreased range of motion;Decreased activity  tolerance;Decreased mobility;Decreased safety awareness;Decreased knowledge of precautions;Pain  PT Treatment Interventions DME instruction;Gait training;Stair training;Functional mobility training;Therapeutic activities;Therapeutic exercise;Balance training;Patient/family education   PT Goals (Current goals can be found in the Care Plan section) Acute Rehab PT Goals Patient Stated Goal: to feel better PT Goal Formulation: With patient Time For Goal Achievement: 09/01/15 Potential to Achieve Goals: Good    Frequency Min 3X/week   Barriers to discharge Inaccessible home environment;Decreased caregiver support Steps at granddaughter's home where she will go at d/c    Co-evaluation               End of Session   Activity Tolerance: Patient limited by lethargy (nausea) Patient left: in bed;with call bell/phone within reach;with bed alarm set Nurse Communication: Mobility status;Other (comment) (+emesis multiple times)    Functional Assessment Tool Used: Clinical Judgement Functional Limitation: Mobility: Walking and moving around Mobility: Walking and Moving Around Current Status [phone]): At least 20 percent but less than 40 percent impaired, limited or restricted Mobility: Walking and Moving Around Goal Status [phone]): At least 1 percent but less than 20 percent impaired, limited or restricted    Time: 1405-1430 (pt not charged for 5 min nauseous lying in bed w/ +emesis) PT Time Calculation (min) (ACUTE ONLY): 25 min   Charges:   PT Evaluation $PT Eval Low Complexity: 1 Procedure     PT G Codes:   PT G-Codes **NOT FOR INPATIENT CLASS** Functional Assessment Tool Used: Clinical Judgement Functional Limitation: Mobility: Walking and moving around Mobility: Walking and Moving Around Current Status [phone]): At least 20 percent but less than 40 percent impaired, limited or restricted Mobility: Walking and Moving Around Goal Status [phone]): At least 1 percent but less than 20  percent impaired, limited or restricted   Collie Siad PT, DPT  Pager: [phone] Phone: [phone] 08/18/2015, 4:00 PM

## 2015-08-18 NOTE — ED Notes (Signed)
To ct and xray

## 2015-08-18 NOTE — Progress Notes (Signed)
Pt admitted to 6N07 via stretcher from ED.  Pt AAO X 4.  Pt on 2L O2.  Pt has 20G to rt hand SL.  Pt has sling to lt arm and bruising to to l;eft back and flank area.  Family at bedside.  Report rcvd from [place], [place].  Will continue to monitor.

## 2015-08-18 NOTE — Consult Note (Signed)
ORTHOPAEDIC CONSULTATION  REQUESTING PHYSICIAN: Davonna Belling, MD  PCP:  No primary care provider on file.  Chief Complaint: Evaluate left scapula fracture.  HPI: Antoinnette Hollomon is a 82 y.o. female who lives in [place] and is visiting family here in [place].she got up around 1 AM to go to the bathroom and she fell down a flight of stairs. Her workup revealed multiple rib fractures, lung contusions, and a left scapular body fracture. She was placed in a sling in the emergency department. Orthopedic consultation was obtained for management of her left scapula fracture. She denies injuries to either lower extremity as well as her right upper extremity.  Past Medical History  Diagnosis Date  . Hypertension    History reviewed. No pertinent past surgical history. Social History   Social History  . Marital Status: Married    Spouse Name: N/A  . Number of Children: N/A  . Years of Education: N/A   Social History Main Topics  . Smoking status: Never Smoker   . Smokeless tobacco: None  . Alcohol Use: No  . Drug Use: None  . Sexual Activity: Not Asked   Other Topics Concern  . None   Social History Narrative  . None   No family history on file. No Known Allergies Prior to Admission medications   Medication Sig Start Date End Date Taking? Authorizing Provider  aspirin EC 81 MG tablet Take 81 mg by mouth daily.   Yes Historical Provider, MD  diltiazem (TIAZAC) 300 MG 24 hr capsule Take 300 mg by mouth daily.   Yes Historical Provider, MD  furosemide (LASIX) 40 MG tablet Take 40 mg by mouth daily.   Yes Historical Provider, MD  simvastatin (ZOCOR) 20 MG tablet Take 20 mg by mouth daily.   Yes Historical Provider, MD   Ct Head Wo Contrast  08/18/2015  CLINICAL DATA:  Level 2 trauma. Patient fell down 10 steps. Concern for head, maxillofacial or cervical spine injury. Initial encounter. EXAM: CT HEAD WITHOUT CONTRAST CT MAXILLOFACIAL WITHOUT CONTRAST CT CERVICAL  SPINE WITHOUT CONTRAST TECHNIQUE: Multidetector CT imaging of the head, cervical spine, and maxillofacial structures were performed using the standard protocol without intravenous contrast. Multiplanar CT image reconstructions of the cervical spine and maxillofacial structures were also generated. COMPARISON:  None. FINDINGS: CT HEAD FINDINGS There is no evidence of acute infarction, mass lesion, or intra- or extra-axial hemorrhage on CT. Scattered periventricular and subcortical white matter change likely reflects small vessel ischemic microangiopathy. Mild cerebellar atrophy is noted. A tiny focus of increased attenuation at the high left posterior parietal lobe is thought to reflect a vessel. The brainstem and fourth ventricle are within normal limits. The third and lateral ventricles, and basal ganglia are unremarkable in appearance. The cerebral hemispheres are symmetric in appearance, with normal gray-white differentiation. No mass effect or midline shift is seen. There is no evidence of fracture; visualized osseous structures are unremarkable in appearance. The orbits are within normal limits. The paranasal sinuses and mastoid air cells are well-aerated. Soft tissue swelling is noted overlying the left posterior parietal calvarium. CT MAXILLOFACIAL FINDINGS There appears to be a small fracture extending across the anterior aspect of the roots of the left central and lateral maxillary incisors. There is no additional evidence of fracture. The mandible appears intact. The nasal bone is unremarkable in appearance. Degenerative change is noted at the temporomandibular joints bilaterally, with flattening of the mandibular condylar heads. A periapical abscess is noted at the root of the  right lateral maxillary incisor. The orbits are intact bilaterally. The visualized paranasal sinuses and mastoid air cells are well-aerated. No significant soft tissue abnormalities are seen. The parapharyngeal fat planes are  preserved. The nasopharynx, oropharynx and hypopharynx are unremarkable in appearance. The visualized portions of the valleculae and piriform sinuses are grossly unremarkable. The parotid and submandibular glands are within normal limits. No cervical lymphadenopathy is seen. CT CERVICAL SPINE FINDINGS There appear to be nondisplaced fractures through the left transverse processes of T2 and T3. No additional fractures are seen. There is minimal grade 1 anterolisthesis of C4 on C5. Multilevel disc space narrowing is noted along the cervical spine, with scattered anterior and posterior disc osteophyte complexes, and underlying facet disease. Vertebral bodies demonstrate normal height. Prevertebral soft tissues are within normal limits. Tiny hypodensities within the thyroid gland are likely benign, given their size. Scarring is noted at the lung apices. A tiny left apical pneumothorax is noted. Mild scattered soft tissue air is seen posterior to the left lung apex. IMPRESSION: 1. No evidence of traumatic intracranial injury. 2. Small fracture extending across the anterior aspect of the roots of the left central and lateral maxillary incisors. 3. Nondisplaced fractures through the left transverse processes of T2 and T3. 4. Soft tissue swelling overlying the left posterior parietal calvarium. 5. Scattered small vessel ischemic microangiopathy. 6. Degenerative change at the temporomandibular joints bilaterally, with flattening of the mandibular condylar heads. 7. Periapical abscess at the root of the right lateral maxillary incisor. 8. Degenerative change along the cervical spine. 9. Scarring at the lung apices. Tiny left apical pneumothorax noted. 10. Mild scattered soft tissue air posterior to the left lung apex. These results were called by telephone at the time of interpretation on 08/18/2015 at 3:46 am to Dr. Davonna Belling, who verbally acknowledged these results. Electronically Signed   By: Garald Balding M.D.   On:  08/18/2015 03:47   Ct Chest W Contrast  08/18/2015  CLINICAL DATA:  Level 2 trauma.  Fall down steps. Initial encounter. EXAM: CT CHEST, ABDOMEN, AND PELVIS WITH CONTRAST TECHNIQUE: Multidetector CT imaging of the chest, abdomen and pelvis was performed following the standard protocol during bolus administration of intravenous contrast. CONTRAST:  174mL ISOVUE-300 IOPAMIDOL (ISOVUE-300) INJECTION 61% COMPARISON:  None. FINDINGS: CT CHEST FINDINGS THORACIC INLET/BODY WALL: Soft tissue gas posterior to the left upper chest and around the left shoulder girdle, presumably from pulmonic air leak as no laceration is seen. Left mastectomy.  No axillary adenopathy. Sub cm right thyroid nodule. MEDIASTINUM: Mild cardiomegaly. No pericardial effusion. No mediastinal hematoma or evidence of acute vascular injury. Small sliding hiatal hernia. LUNG WINDOWS: Small left pneumothorax, less than 5%. Minor lung contusion peripherally in the left lower lobe. Patchy bilateral atelectasis. When accounting for extrapleural hemorrhage there is no hemothorax. OSSEOUS: See below CT ABDOMEN AND PELVIS FINDINGS BODY WALL: Subcutaneous contusion around the left flank. Hepatobiliary: No focal liver abnormality.No evidence of biliary obstruction or stone. Pancreas: Unremarkable. Spleen: Unremarkable. Adrenals/Urinary Tract: Negative adrenals. No evidence of renal injury. Unremarkable bladder. Reproductive:Abnormally thickened endometrium at 22 mm. Stomach/Bowel: No evidence of injury. Distal colonic diverticulosis. Vascular/Lymphatic: No acute vascular abnormality. No mass or adenopathy. Peritoneal: No ascites or pneumoperitoneum. Musculoskeletal: Comminuted fracture of the medial scapula on the left with multiple displaced and rotated fragments. Left second through seventh rib fractures with up to moderate displacement. There is extrapleural hemorrhage and gas. Chronic appearing T6 and T9 superior endplate fractures. T10 vertebral body  hemangioma. T2 and T3 left  transverse process fractures, nondisplaced and better seen on cervical spine CT - included for continuity. Lumbar predominant disc degeneration and facet arthropathy. Critical Value/emergent results were called by telephone at the time of interpretation on 08/18/2015 at 4:11 am to Dr. Davonna Belling , who verbally acknowledged these results. IMPRESSION: 1. Small left pneumothorax, less than 5%. Minor left lung contusion. 2. Left second through seventh rib fractures with up to moderate displacement. 3. T2 and T3 left transverse process fractures, nondisplaced. 4. Comminuted and displaced left medial scapula fracture. 5. Left flank contusion.  No evidence of intra-abdominal injury. 6. Abnormally thickened endometrium at 22 mm, possible carcinoma. Recommend follow-up pelvic ultrasound after convalescence. Electronically Signed   By: Monte Fantasia M.D.   On: 08/18/2015 04:12   Ct Cervical Spine Wo Contrast  08/18/2015  CLINICAL DATA:  Level 2 trauma. Patient fell down 10 steps. Concern for head, maxillofacial or cervical spine injury. Initial encounter. EXAM: CT HEAD WITHOUT CONTRAST CT MAXILLOFACIAL WITHOUT CONTRAST CT CERVICAL SPINE WITHOUT CONTRAST TECHNIQUE: Multidetector CT imaging of the head, cervical spine, and maxillofacial structures were performed using the standard protocol without intravenous contrast. Multiplanar CT image reconstructions of the cervical spine and maxillofacial structures were also generated. COMPARISON:  None. FINDINGS: CT HEAD FINDINGS There is no evidence of acute infarction, mass lesion, or intra- or extra-axial hemorrhage on CT. Scattered periventricular and subcortical white matter change likely reflects small vessel ischemic microangiopathy. Mild cerebellar atrophy is noted. A tiny focus of increased attenuation at the high left posterior parietal lobe is thought to reflect a vessel. The brainstem and fourth ventricle are within normal limits. The third  and lateral ventricles, and basal ganglia are unremarkable in appearance. The cerebral hemispheres are symmetric in appearance, with normal gray-white differentiation. No mass effect or midline shift is seen. There is no evidence of fracture; visualized osseous structures are unremarkable in appearance. The orbits are within normal limits. The paranasal sinuses and mastoid air cells are well-aerated. Soft tissue swelling is noted overlying the left posterior parietal calvarium. CT MAXILLOFACIAL FINDINGS There appears to be a small fracture extending across the anterior aspect of the roots of the left central and lateral maxillary incisors. There is no additional evidence of fracture. The mandible appears intact. The nasal bone is unremarkable in appearance. Degenerative change is noted at the temporomandibular joints bilaterally, with flattening of the mandibular condylar heads. A periapical abscess is noted at the root of the right lateral maxillary incisor. The orbits are intact bilaterally. The visualized paranasal sinuses and mastoid air cells are well-aerated. No significant soft tissue abnormalities are seen. The parapharyngeal fat planes are preserved. The nasopharynx, oropharynx and hypopharynx are unremarkable in appearance. The visualized portions of the valleculae and piriform sinuses are grossly unremarkable. The parotid and submandibular glands are within normal limits. No cervical lymphadenopathy is seen. CT CERVICAL SPINE FINDINGS There appear to be nondisplaced fractures through the left transverse processes of T2 and T3. No additional fractures are seen. There is minimal grade 1 anterolisthesis of C4 on C5. Multilevel disc space narrowing is noted along the cervical spine, with scattered anterior and posterior disc osteophyte complexes, and underlying facet disease. Vertebral bodies demonstrate normal height. Prevertebral soft tissues are within normal limits. Tiny hypodensities within the thyroid  gland are likely benign, given their size. Scarring is noted at the lung apices. A tiny left apical pneumothorax is noted. Mild scattered soft tissue air is seen posterior to the left lung apex. IMPRESSION: 1. No evidence of traumatic  intracranial injury. 2. Small fracture extending across the anterior aspect of the roots of the left central and lateral maxillary incisors. 3. Nondisplaced fractures through the left transverse processes of T2 and T3. 4. Soft tissue swelling overlying the left posterior parietal calvarium. 5. Scattered small vessel ischemic microangiopathy. 6. Degenerative change at the temporomandibular joints bilaterally, with flattening of the mandibular condylar heads. 7. Periapical abscess at the root of the right lateral maxillary incisor. 8. Degenerative change along the cervical spine. 9. Scarring at the lung apices. Tiny left apical pneumothorax noted. 10. Mild scattered soft tissue air posterior to the left lung apex. These results were called by telephone at the time of interpretation on 08/18/2015 at 3:46 am to Dr. Davonna Belling, who verbally acknowledged these results. Electronically Signed   By: Garald Balding M.D.   On: 08/18/2015 03:47   Ct Abdomen Pelvis W Contrast  08/18/2015  CLINICAL DATA:  Level 2 trauma.  Fall down steps. Initial encounter. EXAM: CT CHEST, ABDOMEN, AND PELVIS WITH CONTRAST TECHNIQUE: Multidetector CT imaging of the chest, abdomen and pelvis was performed following the standard protocol during bolus administration of intravenous contrast. CONTRAST:  113mL ISOVUE-300 IOPAMIDOL (ISOVUE-300) INJECTION 61% COMPARISON:  None. FINDINGS: CT CHEST FINDINGS THORACIC INLET/BODY WALL: Soft tissue gas posterior to the left upper chest and around the left shoulder girdle, presumably from pulmonic air leak as no laceration is seen. Left mastectomy.  No axillary adenopathy. Sub cm right thyroid nodule. MEDIASTINUM: Mild cardiomegaly. No pericardial effusion. No mediastinal  hematoma or evidence of acute vascular injury. Small sliding hiatal hernia. LUNG WINDOWS: Small left pneumothorax, less than 5%. Minor lung contusion peripherally in the left lower lobe. Patchy bilateral atelectasis. When accounting for extrapleural hemorrhage there is no hemothorax. OSSEOUS: See below CT ABDOMEN AND PELVIS FINDINGS BODY WALL: Subcutaneous contusion around the left flank. Hepatobiliary: No focal liver abnormality.No evidence of biliary obstruction or stone. Pancreas: Unremarkable. Spleen: Unremarkable. Adrenals/Urinary Tract: Negative adrenals. No evidence of renal injury. Unremarkable bladder. Reproductive:Abnormally thickened endometrium at 22 mm. Stomach/Bowel: No evidence of injury. Distal colonic diverticulosis. Vascular/Lymphatic: No acute vascular abnormality. No mass or adenopathy. Peritoneal: No ascites or pneumoperitoneum. Musculoskeletal: Comminuted fracture of the medial scapula on the left with multiple displaced and rotated fragments. Left second through seventh rib fractures with up to moderate displacement. There is extrapleural hemorrhage and gas. Chronic appearing T6 and T9 superior endplate fractures. T10 vertebral body hemangioma. T2 and T3 left transverse process fractures, nondisplaced and better seen on cervical spine CT - included for continuity. Lumbar predominant disc degeneration and facet arthropathy. Critical Value/emergent results were called by telephone at the time of interpretation on 08/18/2015 at 4:11 am to Dr. Davonna Belling , who verbally acknowledged these results. IMPRESSION: 1. Small left pneumothorax, less than 5%. Minor left lung contusion. 2. Left second through seventh rib fractures with up to moderate displacement. 3. T2 and T3 left transverse process fractures, nondisplaced. 4. Comminuted and displaced left medial scapula fracture. 5. Left flank contusion.  No evidence of intra-abdominal injury. 6. Abnormally thickened endometrium at 22 mm, possible  carcinoma. Recommend follow-up pelvic ultrasound after convalescence. Electronically Signed   By: Monte Fantasia M.D.   On: 08/18/2015 04:12   Dg Pelvis Portable  08/18/2015  CLINICAL DATA:  Status post fall, with concern for pelvic injury. Initial encounter. EXAM: PORTABLE PELVIS 1-2 VIEWS COMPARISON:  None. FINDINGS: There is no evidence of fracture or dislocation. Both femoral heads are seated normally within their respective acetabula. Mild degenerative change  is noted at the lower lumbar spine. The sacroiliac joints are unremarkable in appearance. The visualized bowel gas pattern is grossly unremarkable in appearance. IMPRESSION: No evidence of fracture or dislocation. Electronically Signed   By: Garald Balding M.D.   On: 08/18/2015 02:56   Dg Chest Portable 1 View  08/18/2015  CLINICAL DATA:  Status post fall, with concern for chest injury. Initial encounter. EXAM: PORTABLE CHEST 1 VIEW COMPARISON:  None. FINDINGS: The lungs are well-aerated. A small left pleural effusion is noted. Mild vascular congestion is seen. Mild bibasilar atelectasis is noted. There is no evidence of pleural effusion or pneumothorax. The cardiomediastinal silhouette is within normal limits. No acute osseous abnormalities are seen. Scattered clips are seen overlying the left axilla. IMPRESSION: Small left pleural effusion noted. Mild vascular congestion seen. Mild bibasilar atelectasis noted. Electronically Signed   By: Garald Balding M.D.   On: 08/18/2015 02:56   Dg Shoulder Left  08/18/2015  CLINICAL DATA:  Status post fall, with left shoulder pain. Initial encounter. EXAM: LEFT SHOULDER - 2+ VIEW COMPARISON:  None. FINDINGS: There is a displaced and angulated fracture involving the body of the scapula. Overlying soft tissue air and soft tissue hemorrhage are suggested. There appears to be extension into the superior scapula, though this is not well characterized. There appears to be anterior subluxation of the left humeral  head, with anterior narrowing of the glenohumeral joint, thought to reflect underlying ligamentous laxity and chronic degenerative change. No definite dislocation is seen. The proximal left humerus remains intact. The left acromioclavicular joint is grossly unremarkable. The visualized portions of lungs are grossly clear. IMPRESSION: 1. Displaced and angulated fracture involving the body of the scapula, with overlying soft tissue air and suggestion of soft tissue hemorrhage. There appears to be extension into the superior scapula, not fully characterized. 2. Anterior subluxation of the left humeral head, with anterior narrowing of the glenohumeral joint, thought to reflect underlying ligamentous laxity and chronic degenerative change. Electronically Signed   By: Garald Balding M.D.   On: 08/18/2015 02:59   Ct Maxillofacial Wo Cm  08/18/2015  CLINICAL DATA:  Level 2 trauma. Patient fell down 10 steps. Concern for head, maxillofacial or cervical spine injury. Initial encounter. EXAM: CT HEAD WITHOUT CONTRAST CT MAXILLOFACIAL WITHOUT CONTRAST CT CERVICAL SPINE WITHOUT CONTRAST TECHNIQUE: Multidetector CT imaging of the head, cervical spine, and maxillofacial structures were performed using the standard protocol without intravenous contrast. Multiplanar CT image reconstructions of the cervical spine and maxillofacial structures were also generated. COMPARISON:  None. FINDINGS: CT HEAD FINDINGS There is no evidence of acute infarction, mass lesion, or intra- or extra-axial hemorrhage on CT. Scattered periventricular and subcortical white matter change likely reflects small vessel ischemic microangiopathy. Mild cerebellar atrophy is noted. A tiny focus of increased attenuation at the high left posterior parietal lobe is thought to reflect a vessel. The brainstem and fourth ventricle are within normal limits. The third and lateral ventricles, and basal ganglia are unremarkable in appearance. The cerebral hemispheres are  symmetric in appearance, with normal gray-white differentiation. No mass effect or midline shift is seen. There is no evidence of fracture; visualized osseous structures are unremarkable in appearance. The orbits are within normal limits. The paranasal sinuses and mastoid air cells are well-aerated. Soft tissue swelling is noted overlying the left posterior parietal calvarium. CT MAXILLOFACIAL FINDINGS There appears to be a small fracture extending across the anterior aspect of the roots of the left central and lateral maxillary incisors. There is  no additional evidence of fracture. The mandible appears intact. The nasal bone is unremarkable in appearance. Degenerative change is noted at the temporomandibular joints bilaterally, with flattening of the mandibular condylar heads. A periapical abscess is noted at the root of the right lateral maxillary incisor. The orbits are intact bilaterally. The visualized paranasal sinuses and mastoid air cells are well-aerated. No significant soft tissue abnormalities are seen. The parapharyngeal fat planes are preserved. The nasopharynx, oropharynx and hypopharynx are unremarkable in appearance. The visualized portions of the valleculae and piriform sinuses are grossly unremarkable. The parotid and submandibular glands are within normal limits. No cervical lymphadenopathy is seen. CT CERVICAL SPINE FINDINGS There appear to be nondisplaced fractures through the left transverse processes of T2 and T3. No additional fractures are seen. There is minimal grade 1 anterolisthesis of C4 on C5. Multilevel disc space narrowing is noted along the cervical spine, with scattered anterior and posterior disc osteophyte complexes, and underlying facet disease. Vertebral bodies demonstrate normal height. Prevertebral soft tissues are within normal limits. Tiny hypodensities within the thyroid gland are likely benign, given their size. Scarring is noted at the lung apices. A tiny left apical  pneumothorax is noted. Mild scattered soft tissue air is seen posterior to the left lung apex. IMPRESSION: 1. No evidence of traumatic intracranial injury. 2. Small fracture extending across the anterior aspect of the roots of the left central and lateral maxillary incisors. 3. Nondisplaced fractures through the left transverse processes of T2 and T3. 4. Soft tissue swelling overlying the left posterior parietal calvarium. 5. Scattered small vessel ischemic microangiopathy. 6. Degenerative change at the temporomandibular joints bilaterally, with flattening of the mandibular condylar heads. 7. Periapical abscess at the root of the right lateral maxillary incisor. 8. Degenerative change along the cervical spine. 9. Scarring at the lung apices. Tiny left apical pneumothorax noted. 10. Mild scattered soft tissue air posterior to the left lung apex. These results were called by telephone at the time of interpretation on 08/18/2015 at 3:46 am to Dr. Davonna Belling, who verbally acknowledged these results. Electronically Signed   By: Garald Balding M.D.   On: 08/18/2015 03:47    Positive ROS: All other systems have been reviewed and were otherwise negative with the exception of those mentioned in the HPI and as above.  Physical Exam: General: Alert, no acute distress Cardiovascular: No pedal edema Respiratory: No cyanosis, no use of accessory musculature GI: No organomegaly, abdomen is soft and non-tender Skin: No lesions in the area of chief complaint Neurologic: Sensation intact distally Psychiatric: Patient is competent for consent with normal mood and affect Lymphatic: No axillary or cervical lymphadenopathy  MUSCULOSKELETAL:  Right upper extremity:Full active range of motion. No crepitation, no deformity, no tenderness to palpation.he is neurovascularly intact.  Bilateral lower extremities: No skin wounds or lesions no tenderness to palpation no crepitation. She can perform straight leg raises  bilaterally. No pain with range of motion of the hips knees or ankles.No ligamentous instability to the knees.  Left upper extremity: sling is intact. She does have pain with range of motion of the shoulder. She has ecchymosis over the lateral aspect of the shoulder. The elbow is nontender.. The wrist is nontender. She has positive motor function AIN, PI and, and ulnar motor nerves. Sensation is intact to light touch in the axillary, musculocutaneous, radial, median, and ulnar nerve distributions. She has 2+ radial pulse.  Assessment: Status post fall down a flight of stairs with: 1. left scapular body fracture, amenable  to conservative management.  Other injuries include: Small left pneumothorax Left lung contusion Left rib fractures 2 through 7 LeftT2 and T3 transverse process fractures  Plan: I discussed the findings with the patient and her family. The treatment for scapular body fracture includes symptomatic treatment for -2 weeks in a sling. Then she can begin gentle range of motion of the shoulder. She will need to be nonweightbearing to the left upper extremity fo 6 weeks. The family tells me she is going back to [place] later this week, so I would recommend follow-up with a local orthopedic surgeon in about 2 weeks after discharge. All questions were solicited and answered to their satisfaction.   Marjan Rosman, Horald Pollen, MD Cell [phone]    08/18/2015 6:42 AM

## 2015-08-19 ENCOUNTER — Observation Stay (HOSPITAL_COMMUNITY): Payer: Medicare Other

## 2015-08-19 LAB — BASIC METABOLIC PANEL
ANION GAP: 6 (ref 5–15)
Anion gap: 10 (ref 5–15)
BUN: 25 mg/dL — AB (ref 6–20)
BUN: 23 mg/dL — ABNORMAL HIGH (ref 6–20)
CALCIUM: 8.9 mg/dL (ref 8.9–10.3)
CHLORIDE: 107 mmol/L (ref 101–111)
CO2: 24 mmol/L (ref 22–32)
CO2: 27 mmol/L (ref 22–32)
Calcium: 8.5 mg/dL — ABNORMAL LOW (ref 8.9–10.3)
Chloride: 108 mmol/L (ref 101–111)
Creatinine, Ser: 1.25 mg/dL — ABNORMAL HIGH (ref 0.44–1.00)
Creatinine, Ser: 1.22 mg/dL — ABNORMAL HIGH (ref 0.44–1.00)
GFR calc Af Amer: 44 mL/min — ABNORMAL LOW (ref >60)
GFR calc non Af Amer: 39 mL/min — ABNORMAL LOW (ref >60)
GFR, EST AFRICAN AMERICAN: 45 mL/min — AB (ref >60)
GFR, EST NON AFRICAN AMERICAN: 38 mL/min — AB (ref >60)
GLUCOSE: 111 mg/dL — AB (ref 65–99)
GLUCOSE: 105 mg/dL — AB (ref 65–99)
POTASSIUM: 4.4 mmol/L (ref 3.5–5.1)
Potassium: 4.7 mmol/L (ref 3.5–5.1)
Sodium: 142 mmol/L (ref 135–145)
Sodium: 140 mmol/L (ref 135–145)

## 2015-08-19 LAB — CBC
HCT: 31.9 % — ABNORMAL LOW (ref 36.0–46.0)
Hemoglobin: 10.4 g/dL — ABNORMAL LOW (ref 12.0–15.0)
MCH: 30.6 pg (ref 26.0–34.0)
MCHC: 32.6 g/dL (ref 30.0–36.0)
MCV: 93.8 fL (ref 78.0–100.0)
PLATELETS: 173 10*3/uL (ref 150–400)
RBC: 3.4 MIL/uL — ABNORMAL LOW (ref 3.87–5.11)
RDW: 14 % (ref 11.5–15.5)
WBC: 12.4 10*3/uL — ABNORMAL HIGH (ref 4.0–10.5)

## 2015-08-19 MED ORDER — PROCHLORPERAZINE EDISYLATE 5 MG/ML IJ SOLN
10.0000 mg | Freq: Four times a day (QID) | INTRAMUSCULAR | Status: DC | PRN
  Filled 2015-08-19: qty 2

## 2015-08-19 MED ORDER — MORPHINE SULFATE (PF) 2 MG/ML IV SOLN: 2.0000 mg | INTRAVENOUS | Status: DC | PRN

## 2015-08-19 MED ORDER — POLYETHYLENE GLYCOL 3350 17 G PO PACK
17.0000 g | PACK | Freq: Every day | ORAL | Status: DC
  Administered 2015-08-19 – 2015-08-21 (×3): 17 g via ORAL
  Filled 2015-08-19 (×3): qty 1

## 2015-08-19 MED ORDER — DOCUSATE SODIUM 100 MG PO CAPS
100.0000 mg | ORAL_CAPSULE | Freq: Two times a day (BID) | ORAL | Status: DC
  Administered 2015-08-19 – 2015-08-21 (×4): 100 mg via ORAL
  Filled 2015-08-19 (×5): qty 1

## 2015-08-19 MED ORDER — TRAMADOL HCL 50 MG PO TABS
50.0000 mg | ORAL_TABLET | Freq: Four times a day (QID) | ORAL | Status: DC | PRN
  Administered 2015-08-19 – 2015-08-21 (×7): 50 mg via ORAL
  Filled 2015-08-19 (×7): qty 1

## 2015-08-19 MED ORDER — PNEUMOCOCCAL VAC POLYVALENT 25 MCG/0.5ML IJ INJ
0.5000 mL | INJECTION | INTRAMUSCULAR | Status: DC
  Filled 2015-08-19 (×2): qty 0.5

## 2015-08-19 NOTE — Progress Notes (Signed)
SATURATION QUALIFICATIONS: (This note is used to comply with regulatory documentation for home oxygen)  Patient Saturations on Room Air at Rest = 88%  Patient Saturations on Room Air while Ambulating = 85%  Patient Saturations on 3 Liters of oxygen while Ambulating = 91%  Please briefly explain why patient needs home oxygen:  Pt desaturated on RA during short gait distances with noticeable DOE required 3L to improve O2 sats greater than 90%.  Governor Rooks, PTA pager [phone]

## 2015-08-19 NOTE — Evaluation (Signed)
Occupational Therapy Evaluation Patient Details Name: Yvette Kelly MRN: [id number] DOB: 06-Feb-1926 Today's Date: 08/19/2015    History of Present Illness Pt is a 82 y/o F from [place] visiting her granddaughter in [place] when she got up at night to use the restroom and fell down a flight of steps.  Pt now w/ resultant Lt scapular fx, Lt 2-7 rib fxs, and broken teeth.    Clinical Impression   Pt with decline in function and safety with ADLs and ADL mobility with decreased strength, balance, endurance, L UE ROM/function. Pt would benefit from acute OT services to address impairments to increase level of function and safety    Follow Up Recommendations  Home health OT;Supervision/Assistance - 24 hour    Equipment Recommendations  Tub/shower bench;3 in 1 bedside comode    Recommendations for Other Services       Precautions / Restrictions Precautions Precautions: Fall Required Braces or Orthoses: Sling Restrictions Weight Bearing Restrictions: Yes LUE Weight Bearing: Non weight bearing      Mobility Bed Mobility Overal bed mobility: Needs Assistance Bed Mobility: Rolling Rolling: Min guard         General bed mobility comments: Cues for technique to roll to Rt side for positioning of bed pan  Transfers Overall transfer level: Needs assistance Equipment used: 2 person hand held assist Transfers: Sit to/from [facility] Sit to Stand: Mod assist Stand pivot transfers: Min assist            Balance Overall balance assessment: Needs assistance Sitting-balance support: Single extremity supported;Feet supported Sitting balance-Leahy Scale: Good     Standing balance support: During functional activity;Single extremity supported Standing balance-Leahy Scale: Poor                              ADL Overall ADL's : Needs assistance/impaired     Grooming: Wash/dry hands;Wash/dry face;Set up;Sitting   Upper Body Bathing: Maximal  assistance;Sitting   Lower Body Bathing: Total assistance   Upper Body Dressing : Maximal assistance;Sitting   Lower Body Dressing: Total assistance   Toilet Transfer: Moderate assistance   Toileting- Clothing Manipulation and Hygiene: Total assistance       Functional mobility during ADLs: Minimal assistance;Moderate assistance       Vision  wears glasses, no change from baseline              Pertinent Vitals/Pain Pain Assessment: 0-10 Pain Score: 7  Pain Location: L shoulder Pain Descriptors / Indicators: Aching Pain Intervention(s): Limited activity within patient's tolerance;Monitored during session;Repositioned     Hand Dominance Right   Extremity/Trunk Assessment Upper Extremity Assessment Upper Extremity Assessment: LUE deficits/detail;Generalized weakness LUE: Unable to fully assess due to immobilization           Communication Communication Communication: No difficulties   Cognition Arousal/Alertness: Lethargic;Suspect due to medications Behavior During Therapy: Flat affect Overall Cognitive Status: Within Functional Limits for tasks assessed                     General Comments   pt pleasant and cooperative                 Home Living Family/patient expects to be discharged to:: Private residence Living Arrangements: Children Available Help at Discharge: Family;Available PRN/intermittently Type of Home: House Home Access: Level entry     Home Layout: Two level Alternate Level Stairs-Number of Steps: flight Alternate Level Stairs-Rails: Right Bathroom Shower/Tub:  Tub/shower unit   Bathroom Toilet: Standard     Home Equipment: Environmental consultant - 2 wheels;Cane - single point;Wheelchair - manual   Additional Comments: Pt lives alone in [place].  She is [place] staying w/ her granddaughter which is where she will stay until going home.  When she goes to [place] she says she will be able to stay w/ her son.  Information above is in  regards to her granddaughters home      Prior Functioning/Environment Level of Independence: Independent        Comments: Not using AD to ambulate.  Ind w/ bathing, dressing. At home has a walk in shower.    OT Diagnosis: Generalized weakness;Acute pain   OT Problem List: Decreased strength;Impaired UE functional use;Pain;Impaired balance (sitting and/or standing);Decreased activity tolerance;Decreased range of motion;Decreased knowledge of use of DME or AE   OT Treatment/Interventions: Self-care/ADL training;Therapeutic exercise;Patient/family education;Therapeutic activities;DME and/or AE instruction    OT Goals(Current goals can be found in the care plan section) Acute Rehab OT Goals Patient Stated Goal: get better OT Goal Formulation: With patient/family Time For Goal Achievement: 08/26/15 Potential to Achieve Goals: Good ADL Goals Pt Will Perform Grooming: with set-up;with supervision;sitting Pt Will Perform Upper Body Bathing: with mod assist;sitting Pt Will Perform Lower Body Bathing: with max assist;with mod assist;sitting/lateral leans Pt Will Perform Upper Body Dressing: with mod assist;sitting Pt Will Transfer to Toilet: with min assist;bedside commode  OT Frequency: Min 2X/week   Barriers to D/C: Decreased caregiver support                        End of Session Equipment Utilized During Treatment: Gait belt Nurse Communication: Mobility status  Activity Tolerance: Patient limited by pain Patient left: in chair;with call bell/phone within reach;with nursing/sitter in room;with family/visitor present   Time:  -    Charges:  OT Evaluation $OT Eval Moderate Complexity: 1 Procedure OT Treatments $Therapeutic Activity: 8-22 mins G-Codes:    Britt Bottom 08/19/2015, 1:00 PM

## 2015-08-19 NOTE — Clinical Social Work Note (Signed)
Clinical Social Work Assessment  Patient Details  Name: Yvette Kelly MRN: [id number] Date of Birth: April 29, 1925  Date of referral:  08/19/15               Reason for consult:  Trauma                Permission sought to share information with:  Family Supports Permission granted to share information::  Yes, Verbal Permission Granted  Name::     Yvette Kelly  Relationship::  Son  Contact Information:  [phone]  Housing/Transportation Living arrangements for the past 2 months:  [place] of Information:  Patient Patient Interpreter Needed:  None Criminal Activity/Legal Involvement Pertinent to Current Situation/Hospitalization:  No - Comment as needed Significant Relationships:  Adult Children, Other Family Members Lives with:  Self Do you feel safe going back to the place where you live?  Yes Need for family participation in patient care:  Yes (Comment)  Care giving concerns:  Patient son and daughter in law drove in from [place] and plan to have patient travel back with them when ready.  No family currently at bedside to express concerns.   Social Worker assessment / plan:  Holiday representative met with patient at bedside to offer support and discuss patient needs at discharge.  Patient states that she was staying with her granddaughter, Yvette Kelly, when she fell down about 10 stairs.  Patient states that she was just here visiting and doesn't quite remember how she fell.  Patient plans to return home to the granddaughter's house with family assist until she is able to make the trip back home to [place].  Patient is agreeable to home health if needed - patient son with correct [place] address if needed for home health set up.  Patient states that there was no substance use concerns.  SBIRT complete.  No resources given at this time.  Clinical Social Worker will sign off for now as social work intervention is no longer needed. Please consult [place] again if new need  arises.  Employment status:  Retired Health visitor, Managed Care PT Recommendations:  Home with [place] / Referral to community resources:  SBIRT  Patient/Family's Response to care:  Patient verbalizes understanding of CSW role and appreciation for support and concern.  Patient is agreeable to return home with granddaughter until able to travel home to [place].  Patient/Family's Understanding of and Emotional Response to Diagnosis, Current Treatment, and Prognosis:  Patient with good understanding of her injuries and limitations with travel.  Patient is anxious to return home when able.  Emotional Assessment Appearance:  Appears younger than stated age Attitude/Demeanor/Rapport:   (Appropriate and Engaged) Affect (typically observed):  Appropriate, Calm, Pleasant Orientation:  Oriented to Self, Oriented to Place, Oriented to  Time, Oriented to Situation Alcohol / Substance use:  Never Used Psych involvement (Current and /or in the community):  No (Comment)  Discharge Needs  Concerns to be addressed:  No discharge needs identified Readmission within the last 30 days:  No Current discharge risk:  None Barriers to Discharge:  Continued Medical Work up  [employer], [place]

## 2015-08-19 NOTE — Progress Notes (Signed)
   Subjective: Left sided pain. No improvement with pain medicine due to induced nausea. No relief of nausea with zofran.  Not nauseated this morning. Unable to get up with PT yesterday due to pain and nausea. Has not been able to have a bowel movement.   Objective: Vital signs in last 24 hours: Temp:  [97.4 F (36.3 C)-99 F (37.2 C)] 98.3 F (36.8 C) (06/05 0655) Pulse Rate:  [67-89] 67 (06/05 0655) Resp:  [17-20] 17 (06/05 0655) BP: (131-166)/(45-70) 131/45 mmHg (06/05 0655) SpO2:  [94 %-96 %] 94 % (06/05 0655) Weight:  [71.668 kg (158 lb)] 71.668 kg (158 lb) (06/04 1013) Last BM Date: 08/18/15   Laboratory  CBC  Recent Labs  08/18/15 0145 08/18/15 0202 08/19/15 0731  WBC 15.9*  --  12.4*  HGB 12.5 12.9 10.4*  HCT 38.2 38.0 31.9*  PLT 205  --  173   BMET  Recent Labs  08/18/15 0202 08/19/15 0731  NA 142 142  K 4.1 4.7  CL 103 108  CO2  --  24  GLUCOSE 131* 111*  BUN 34* 25*  CREATININE 1.20* 1.25*  CALCIUM  --  8.9   Imaging Results EXAM: CHEST 2 VIEW  COMPARISON: Chest CT scan and chest x-ray of August 18, 2015  FINDINGS: A faint pleural line is observed in the left apex consistent with a less than 10% pneumothorax multiple left upper rib fractures are observed. There is small left pleural effusion. The right lung is well-expanded and clear. There is apical pleural thickening on the right. The cardiac silhouette is enlarged. The pulmonary vascularity is normal. There is mild tortuosity of the descending thoracic aorta. There are surgical clips in the left axillary region. A lucency through the scapular body is consistent with known scapular fracture.  IMPRESSION: 1. 10% or less left-sided apical pneumothorax. Mild cardiomegaly with small left pleural effusion. 2. Left scapular body fracture and fractures of the lateral aspects of left second through seventh ribs.   Electronically Signed  By: David Martinique M.D.  On: 08/19/2015  08:12  Physical Exam General: alert and oriented, cooperative, no distress Resp: CTAB Cardio: RRR, no M/G/R [id number], nontender, normal bowel sounds   Assessment/Plan: Fall down stairs with the following injuries: 1. Left rib fractures x 6, 2-7,; 2. Left pulmonary contusion; 3. Left scapular fracture; 4. Broken teeth and dislodgement; 5. Small CT diagnosed PTX;  Change pain medications to PO tramadol and IV morphine  and antiemetic to compazine to get better control of pain. PT/OT. Left Scapular fx: Keep left UE in sling and non-weight bearing per Dr. Rod Can Elevated Creatinine - slight increase. Most likely contrast induced. Increase IV fluids and repeat BMP. FEN - regular diet. Continue with miralax, added colace for bowel regimen. Increased IV fluids. VTE - lovenox Dispo - pain management   Kelly Rayburn PA-S  08/19/2015

## 2015-08-19 NOTE — Progress Notes (Signed)
Physical Therapy Treatment Patient Details Name: Yvette Kelly MRN: [id number] DOB: [birth year] Today's Date: 08/19/2015    History of Present Illness Pt is a 82 y/o F from [place] visiting her granddaughter in [place] when she got up at night to use the restroom and fell down a flight of steps.  Pt now w/ resultant Lt scapular fx, Lt 2-7 rib fxs, and broken teeth.     PT Comments    Pt required encouragement to advance mobility.  Pt denies nausea and reports increased pain.  Pt performed short gait distances on RA and desats to 85% required 3L to maintain O2 sats greater than 90%.    Follow Up Recommendations  Home health PT;Supervision for mobility/OOB     Equipment Recommendations  Other (comment) (TBD as pt progresses.  )    Recommendations for Other Services       Precautions / Restrictions Precautions Precautions: Fall Required Braces or Orthoses: Sling Restrictions Weight Bearing Restrictions: Yes LUE Weight Bearing: Non weight bearing    Mobility  Bed Mobility Overal bed mobility: Needs Assistance Bed Mobility: Rolling Rolling: Min guard         General bed mobility comments: Pt recieved in recliner chair  Transfers Overall transfer level: Needs assistance Equipment used: 1 person hand held assist Transfers: Sit to/from Stand Sit to Stand: Min assist Stand pivot transfers: Min assist       General transfer comment: Pt required cues for hand placement, B hip/knee extension.  Stood edge of chair to allow patient to correct balance before advancing to gait training.    Ambulation/Gait Ambulation/Gait assistance: Min assist Ambulation Distance (Feet): 15 Feet (x2 + 80 ft) Assistive device: 1 person hand held assist Gait Pattern/deviations: Step-through pattern;Decreased stride length;Trunk flexed;Decreased weight shift to left;Decreased weight shift to right   Gait velocity interpretation: Below normal speed for age/gender General Gait Details: Cues for  increased B stride and foot clearance.  performed trial of 15 ft x2 from bed to sink on RA and desats to 85%, required 3L to maintain O2 sats.     Stairs            Wheelchair Mobility    Modified Rankin (Stroke Patients Only)       Balance Overall balance assessment: Needs assistance Sitting-balance support: Single extremity supported;Feet supported Sitting balance-Leahy Scale: Good     Standing balance support: During functional activity;Single extremity supported Standing balance-Leahy Scale: Poor                      Cognition Arousal/Alertness: Lethargic;Suspect due to medications Behavior During Therapy: Flat affect Overall Cognitive Status: Within Functional Limits for tasks assessed                      Exercises      General Comments        Pertinent Vitals/Pain Pain Assessment: 0-10 Pain Score: 7  Pain Location: L shoulder/ rib cage. Pain Descriptors / Indicators: Aching;Grimacing;Guarding Pain Intervention(s): Monitored during session;Repositioned (refused Ice)    Home Living Family/patient expects to be discharged to:: Private residence Living Arrangements: Children Available Help at Discharge: Family;Available PRN/intermittently Type of Home: House Home Access: Level entry   Home Layout: Two level Home Equipment: Walker - 2 wheels;Cane - single point;Wheelchair - manual Additional Comments: Pt lives alone in [place].  She is [place] staying w/ her granddaughter which is where she will stay until going home.  When she goes to [place] she  says she will be able to stay w/ her son.  Information above is in regards to her granddaughters home    Prior Function Level of Independence: Independent      Comments: Not using AD to ambulate.  Ind w/ bathing, dressing. At home has a walk in shower.   PT Goals (current goals can now be found in the care plan section) Acute Rehab PT Goals Patient Stated Goal: get better Potential to  Achieve Goals: Good Progress towards PT goals: Progressing toward goals    Frequency  Min 3X/week    PT Plan Current plan remains appropriate    Co-evaluation             End of Session Equipment Utilized During Treatment:  (did not use gait belt due to rib fractures.  ) Activity Tolerance: Patient tolerated treatment well Patient left: in chair;with call bell/phone within reach;with family/visitor present     Time: [id number] PT Time Calculation (min) (ACUTE ONLY): 32 min  Charges:  $Gait Training: 8-22 mins $Therapeutic Activity: 8-22 mins                    G Codes:      Cristela Blue 09/03/15, 2:03 PM  Governor Rooks, PTA pager [phone]

## 2015-08-20 LAB — BASIC METABOLIC PANEL
ANION GAP: 7 (ref 5–15)
BUN: 18 mg/dL (ref 6–20)
CO2: 24 mmol/L (ref 22–32)
Calcium: 8.5 mg/dL — ABNORMAL LOW (ref 8.9–10.3)
Chloride: 107 mmol/L (ref 101–111)
Creatinine, Ser: 1.06 mg/dL — ABNORMAL HIGH (ref 0.44–1.00)
GFR, EST AFRICAN AMERICAN: 53 mL/min — AB (ref >60)
GFR, EST NON AFRICAN AMERICAN: 46 mL/min — AB (ref >60)
GLUCOSE: 100 mg/dL — AB (ref 65–99)
POTASSIUM: 4.6 mmol/L (ref 3.5–5.1)
Sodium: 138 mmol/L (ref 135–145)

## 2015-08-20 MED ORDER — ACETAMINOPHEN 325 MG PO TABS
325.0000 mg | ORAL_TABLET | Freq: Four times a day (QID) | ORAL | Status: DC | PRN
Start: 2015-08-20 — End: 2015-08-21

## 2015-08-20 MED ORDER — SODIUM CHLORIDE 0.9% FLUSH: 3.0000 mL | INTRAVENOUS | Status: DC | PRN

## 2015-08-20 MED ORDER — SODIUM CHLORIDE 0.9% FLUSH
3.0000 mL | Freq: Two times a day (BID) | INTRAVENOUS | Status: DC
  Administered 2015-08-20 – 2015-08-21 (×3): 3 mL via INTRAVENOUS

## 2015-08-20 NOTE — Progress Notes (Signed)
Patient ID: Yvette Kelly, female   DOB: 1925-10-11, 82 y.o.   MRN: [id number]    Subjective: Nausea resolved. No sob.  Vss. Afebrile.  Good uop   Objective: Vital signs in last 24 hours: Temp:  [97.7 F (36.5 C)-98.7 F (37.1 C)] 97.7 F (36.5 C) (06/06 0522) Pulse Rate:  [68-74] 68 (06/06 0522) Resp:  [17-19] 19 (06/06 0522) BP: (123-140)/(47-74) 140/48 mmHg (06/06 0522) SpO2:  [91 %-95 %] 91 % (06/06 0522) Last BM Date: 08/19/15  Lab Results:  CBC  Recent Labs  08/18/15 0145 08/18/15 0202 08/19/15 0731  WBC 15.9*  --  12.4*  HGB 12.5 12.9 10.4*  HCT 38.2 38.0 31.9*  PLT 205  --  173   BMET  Recent Labs  08/19/15 0731 08/19/15 1222  NA 142 140  K 4.7 4.4  CL 108 107  CO2 24 27  GLUCOSE 111* 105*  BUN 25* 23*  CREATININE 1.25* 1.22*  CALCIUM 8.9 8.5*    Imaging: Dg Chest 2 View  08/19/2015  CLINICAL DATA:  Multiple left rib fractures, small medial left pneumothorax demonstrated on CT scan yesterday. EXAM: CHEST  2 VIEW COMPARISON:  Chest CT scan and chest x-ray of August 18, 2015 FINDINGS: A faint pleural line is observed in the left apex consistent with a less than 10% pneumothorax multiple left upper rib fractures are observed. There is small left pleural effusion. The right lung is well-expanded and clear. There is apical pleural thickening on the right. The cardiac silhouette is enlarged. The pulmonary vascularity is normal. There is mild tortuosity of the descending thoracic aorta. There are surgical clips in the left axillary region. A lucency through the scapular body is consistent with known scapular fracture. IMPRESSION: 1. 10% or less left-sided apical pneumothorax. Mild cardiomegaly with small left pleural effusion. 2. Left scapular body fracture and fractures of the lateral aspects of left second through seventh ribs. Electronically Signed   By: David  Martinique M.D.   On: 08/19/2015 08:12     PE: General appearance: alert, cooperative and no  distress Resp: clear to auscultation bilaterally Cardio: regular rate and rhythm, S1, S2 normal, no murmur, click, rub or gallop GI: soft, non-tender; bowel sounds normal; no masses,  no organomegaly Extremities: extremities normal, atraumatic, no cyanosis or edema    Patient Active Problem List   Diagnosis Date Noted  . Multiple rib fractures 08/18/2015   Assessment/Plan: Fall down stairs Left rib fractures x 6, 2-7-pulmonary toilet, pulling 567ml on IS. Requiring 02 with ambulation. Left pulmonary contusion Broken teeth and dislodgement Left apical PTX-follow up CXR stable Left Scapular fx: Keep left UE in sling and non-weight bearing per Dr. Rod Can Elevated Creatinine -likely contrast induced, check BMP. FEN - regular diet. Continue with miralax, added colace for bowel regimen.  VTE - lovenox Dispo - needs 24h assist, talking to family now whether this can be provided.  Granddaughter in town having a baby in July so not much help.  Pt from [place], plans to go back, but will stay here for ~1 week.  Medically ready for DC.   Erby Pian, ANP-BC Pager: [phone] General Trauma PA Pager: [phone]   08/20/2015 9:27 AM

## 2015-08-20 NOTE — Progress Notes (Signed)
Met with pt to discuss dc planning.  Pt states she plans to go back to [place] on Wednesday, with her son and daughter and law.  I spoke with Trauma PA, and the recommendation is that pt wait at least one week prior to traveling back to [place].  Pt states doesn't think she can stay at her granddaughter's home, because she will "just fall down the stairs again."  Suggested a hospital bed downstairs while at her granddaughter's..pt stated that she did not think that was necessary.   Yvette Kelly states that I need to call her son, Yvette Kelly, to discuss discharge plans.  Left message on son's mobile voicemail.  Will follow up for dc needs; pt will need HH and DME for home upon dc.     Reinaldo Raddle, RN, BSN  Trauma/Neuro ICU Case Manager [phone]

## 2015-08-20 NOTE — Progress Notes (Signed)
Physical Therapy Treatment Patient Details Name: Yvette Kelly MRN: [id number] DOB: 29-Dec-1925 Today's Date: 08/20/2015    History of Present Illness Pt is a 82 y/o F from [place] visiting her granddaughter in [place] when she got up at night to use the restroom and fell down a flight of steps.  Pt now w/ resultant Lt scapular fx, Lt 2-7 rib fxs, and broken teeth.     PT Comments    Pt performed increased gait distance with use of hemiwalker and required decreased assist.  Pt will require hemiwalker at d/c to improve balance during mobility in home and decreased risk of falls.  Will inform supervising PT and case manager of need for hemiwalker at d/c.    Follow Up Recommendations  Home health PT     Equipment Recommendations  Other (comment) (hemiwalker)    Recommendations for Other Services       Precautions / Restrictions Precautions Precautions: Fall Required Braces or Orthoses: Sling Restrictions Weight Bearing Restrictions: Yes LUE Weight Bearing: Non weight bearing    Mobility  Bed Mobility               General bed mobility comments: Pt recieved in recliner chair on arrival.    Transfers Overall transfer level: Needs assistance Equipment used: Hemi-walker Transfers: Sit to/from Stand Sit to Stand: Supervision Stand pivot transfers: Supervision       General transfer comment: Cues for hand placement to and from seated surface.    Ambulation/Gait Ambulation/Gait assistance: Supervision Ambulation Distance (Feet): 120 Feet   Gait Pattern/deviations: Step-through pattern;Step-to pattern;Trunk flexed;Antalgic     General Gait Details: Cues for increased B stride and foot clearance.  Pt required decreased assistance with hemi walker.     Stairs            Wheelchair Mobility    Modified Rankin (Stroke Patients Only)       Balance Overall balance assessment: Needs assistance Sitting-balance support: No upper extremity supported Sitting  balance-Leahy Scale: Good     Standing balance support: Single extremity supported;During functional activity Standing balance-Leahy Scale: Fair Standing balance comment: Pt able to stand at sink for grooming tasks with one to no UE supported                    Cognition Arousal/Alertness: Awake/alert Behavior During Therapy: WFL for tasks assessed/performed Overall Cognitive Status: Within Functional Limits for tasks assessed                      Exercises      General Comments        Pertinent Vitals/Pain Pain Assessment: 0-10 Pain Score: 7  Faces Pain Scale: Hurts even more Pain Location: L shoulder/ rib cage. Pain Descriptors / Indicators: Discomfort;Guarding;Grimacing Pain Intervention(s): Monitored during session;Repositioned;Patient requesting pain meds-RN notified;RN gave pain meds during session    Home Living                      Prior Function            PT Goals (current goals can now be found in the care plan section) Acute Rehab PT Goals Patient Stated Goal: get better, be independent  Potential to Achieve Goals: Good Progress towards PT goals: Progressing toward goals    Frequency  Min 3X/week    PT Plan Discharge plan needs to be updated    Co-evaluation  End of Session Equipment Utilized During Treatment:  (no gt belt used d/t rib fx's.  ) Activity Tolerance: Patient tolerated treatment well Patient left: in chair;with call bell/phone within reach;with family/visitor present     Time: [id number] PT Time Calculation (min) (ACUTE ONLY): 26 min  Charges:  $Gait Training: 8-22 mins $Therapeutic Activity: 8-22 mins                    G Codes:      Cristela Blue 2015-09-16, 12:54 PM  Governor Rooks, PTA pager [phone]

## 2015-08-20 NOTE — Progress Notes (Signed)
Spoke with pt's son, Cherlynn Kaiser, to discuss [place]'s recommendation of no travel for at least one week, and PT/OT recommendations for [facility] and DME.  Son states he will need to discuss this with pt's granddaughter, (his daughter), as they are staying with her.  Son to call back Case Manager with final decision/arrangements.    Reinaldo Raddle, RN, BSN  Trauma/Neuro ICU Case Manager [phone]

## 2015-08-20 NOTE — Care Management Obs Status (Signed)
[place] NOTIFICATION   Patient Details  Name: Yvette Kelly MRN: [id number] Date of Birth: 1925-09-29   Medicare Observation Status Notification Given:  Yes    Ella Bodo, RN 08/20/2015, 3:10 PM

## 2015-08-20 NOTE — Progress Notes (Signed)
Occupational Therapy Treatment Patient Details Name: Yvette Kelly MRN: [id number] DOB: 06-Feb-1926 Today's Date: 08/20/2015    History of present illness Pt is a 82 y/o F from [place] visiting her granddaughter in [place] when she got up at night to use the restroom and fell down a flight of steps.  Pt now w/ resultant Lt scapular fx, Lt 2-7 rib fxs, and broken teeth.    OT comments  Patient making progress towards OT goals, continue plan of care for now. Pt continues to be limited by pain and decreased mobility/functional use of LUE (as pt is LUE dominant). Discussed importance of patient having 24/7 supervision/assistance post acute d/c and pt stated she understood. Went over importance of NWB to LUE, including no pushing, no pulling, no lifting. Patient on 3L/min supplemental 02 upon OT entering room and sats greater than 90%. During functional activity/ADL, pt on RA and sats decreased to 84%. Sats did not increase to 90% until therapist donned 3L/min supplemental 02 via Speculator.    Follow Up Recommendations  Home health OT;Supervision/Assistance - 24 hour    Equipment Recommendations  Tub/shower bench;3 in 1 bedside comode    Recommendations for Other Services  None at this time   Precautions / Restrictions Precautions Precautions: Fall Required Braces or Orthoses: Sling Restrictions Weight Bearing Restrictions: Yes LUE Weight Bearing: Non weight bearing    Mobility Bed Mobility  General bed mobility comments: Pt received seated EOB upon OT entering room and in recliner upon OT exiting room   Transfers Overall transfer level: Needs assistance Equipment used: 1 person hand held assist Transfers: Sit to/from Stand Sit to Stand: Min assist General transfer comment: Min assist for safety during sit to/from stands. Pt reports difficulty with functional mobility PTA.    Balance Overall balance assessment: Needs assistance Sitting-balance support: No upper extremity supported;Feet  supported Sitting balance-Leahy Scale: Good     Standing balance support: Single extremity supported;During functional activity Standing balance-Leahy Scale: Fair Standing balance comment: Pt able to stand at sink for grooming tasks with one to no UE supported   ADL Overall ADL's : Needs assistance/impaired Eating/Feeding: Set up;Sitting Eating/Feeding Details (indicate cue type and reason): EOB Grooming: Set up;Oral care;Brushing hair;Standing   Upper Body Bathing: Maximal assistance;Sitting   Lower Body Bathing: Total assistance;Sit to/from stand   Upper Body Dressing : Maximal assistance;Sitting   Lower Body Dressing: Total assistance;Sit to/from stand   Toilet Transfer: Minimal Print production planner Details (indicate cue type and reason): simulated      Cognition   Behavior During Therapy: Flat affect Overall Cognitive Status: Within Functional Limits for tasks assessed                 Pertinent Vitals/ Pain       Pain Assessment: Faces Faces Pain Scale: Hurts even more Pain Location: L shoulder/rib cage Pain Descriptors / Indicators: Aching;Sore;Guarding Pain Intervention(s): Limited activity within patient's tolerance;Monitored during session;Repositioned   Frequency Min 2X/week     Progress Toward Goals  OT Goals(current goals can now befound in the care plan section)  Progress towards OT goals: Progressing toward goals  Acute Rehab OT Goals Patient Stated Goal: get better, be independent  OT Goal Formulation: With patient/family Time For Goal Achievement: 08/26/15 Potential to Achieve Goals: Good  Plan Discharge plan remains appropriate    End of Session Equipment Utilized During Treatment: Gait belt;Oxygen   Activity Tolerance Patient tolerated treatment well   Patient Left in chair;with call bell/phone within reach  [id number] OT Time Calculation (min): 21 min  Charges: OT General Charges $OT Visit: 1 Procedure OT  Treatments $Self Care/Home Management : 8-22 mins  Chrys Racer , MS, OTR/L, CLT Pager: [phone]  08/20/2015, 9:24 AM

## 2015-08-21 MED ORDER — TRAMADOL HCL 50 MG PO TABS: 50.0000 mg | ORAL_TABLET | Freq: Four times a day (QID) | ORAL | Status: AC | PRN

## 2015-08-21 MED ORDER — POLYETHYLENE GLYCOL 3350 17 G PO PACK: 17.0000 g | PACK | Freq: Every day | ORAL | Status: AC

## 2015-08-21 MED ORDER — ONDANSETRON 4 MG PO TBDP: 4.0000 mg | ORAL_TABLET | Freq: Three times a day (TID) | ORAL | Status: AC | PRN

## 2015-08-21 MED ORDER — ACETAMINOPHEN 325 MG PO TABS: 325.0000 mg | ORAL_TABLET | Freq: Four times a day (QID) | ORAL | Status: AC | PRN

## 2015-08-21 MED ORDER — DOCUSATE SODIUM 100 MG PO CAPS: 100.0000 mg | ORAL_CAPSULE | Freq: Two times a day (BID) | ORAL | Status: AC

## 2015-08-21 NOTE — Progress Notes (Signed)
Spoke with pt's son, Cherlynn Kaiser to discuss discharge plans:  Family has decided that they do not have room for hospital bed, and plan to return to [place] immediately, once pt is discharged.  Son understands medical staff recommendation to wait on this long trip, but feels that pt will be okay.  He understands that this is against medical advice.  Encouraged son to take frequent stops along the trip to allow pt to get out and walk/stretch.  He verbalizes understanding of this.  Son is agreeable to hemi walker only, but no other DME at this time.  He would like pt's PCP, Dr. Araceli Bouche, to arrange home health care once pt is back in [place].  Will call Dr. Araceli Bouche to discuss home health needs and forward discharge summary once available.  [facility], phone # [phone])  Will notify son when pt is discharged.     Reinaldo Raddle, RN, BSN  Trauma/Neuro ICU Case Manager [phone]

## 2015-08-21 NOTE — Progress Notes (Signed)
Per PT,patient vomited X1 , bileous in color, small amount. Zofran given, patient verbalizes she felt better. Per PT patient oxygen down to 86 %while walking requires 3 L of oxygen. PA made aware.

## 2015-08-21 NOTE — Care Management Note (Signed)
Case Management Note  Patient Details  Name: Yvette Kelly MRN: [id number] Date of Birth: [birth year]  Subjective/Objective:   Pt medically stable for dc today.  Pt continues to desaturate on room air at rest and with ambulation; will need home oxygen.    PT and OT recommending [place] follow up, hemi walker.                   Action/Plan: Son requests that [facility] be arranged by PCP once pt is back in [place].  Spoke with PCP's nurse; will fax H&P and dc summary to PCP, and he will arrange HHPT and OT once pt has arrived back in town.  Referral to [facility] for DME needs; hemi walker to be delivered to pt's room prior to dc.  Referral to [facility] for oxygen needs; spoke with Richard[facility] [employer] [phone]) with [employer], who states she will arrange services to be coordinated with [facility] branch in [place], [place].  Explained to [facility] that pt and family would be driving back to [place] ASAP, and will need multiple e-tanks for travel.  She states she will arrange.  Notified PA that services can be provided.  She will order oxygen, and dc patient.  Confirmed that tanks will be delivered to hospital prior to pt discharge.    Expected Discharge Date:    08/21/2015              Expected Discharge Plan:  [place]  In-House Referral:     Discharge planning Services  CM Consult  Post Acute Care Choice:    Choice offered to:     DME Arranged:  Walker hemi , oxygen DME Agency:  [place]  HH Arranged:  PT, OT [facility] Agency:  Other - See comment  Status of Service:  Completed, will sign off Medicare Important Message Given:    Date Medicare IM Given:    Medicare IM give by:    Date Additional Medicare IM Given:    Additional Medicare Important Message give by:     If discussed at [place] of Stay Meetings, dates discussed:    Additional Comments:  Reinaldo Raddle, RN, BSN  Trauma/Neuro ICU Case Manager [phone]

## 2015-08-21 NOTE — Discharge Summary (Signed)
Physician Discharge Summary  Yvette Kelly [id number] DOB: [birth year] DOA: 08/18/2015  PCP: No primary care provider on file.  Consultation: orthopedics---Dr. Rod Can  Admit date: 08/18/2015 Discharge date: 08/21/2015  Recommendations for Outpatient Follow-up:   Follow-up Information    Follow up with [facility].   Why:  As needed   Contact information:   [place] [phone]      Follow up with primary care doctor.   Why:  hospitalization and to recheck oxygen need and referral to orthopedic surgeon for left scapular fracture     Discharge Diagnoses:  1. Fall down stairs 2. Left rib fractures 6, 2-7 3. hypoxia 4. Left pulmonary contusion 5. Left apical PTX 6. Left scapular fracture 7. AKI 8. Broken teeth and dislodgement   Surgical Procedure: none  Discharge Condition: stable Disposition: home with 24h assistance  Diet recommendation: heart healthy   Filed Weights   08/18/15 1013  Weight: 71.668 kg (158 lb)    Filed Vitals:   08/21/15 0959 08/21/15 1411  BP: 129/53 125/48  Pulse: 74 72  Temp: 98.2 F (36.8 C) 98.2 F (36.8 C)  Resp: 19 19      Hospital Course:  Yvette Kelly is a 82 year old female who presented as a level II trauma to [facility] following a fall down the stairs.  She complained of left sided chest pain.  Found to have above injuries, mostly pulmonary.  Admitted for pain control and therapies.  Found to have a slight increase in sCr which was likely contrast induced and normalized with IV hydration.  Furthermore, orthopedics was consulted for the left scapular fracture who recommended non weight bearing status for 6 weeks, can start gentle range of motion of the shoulder starting in 2 weeks.  The patient was mobilized with therapies.  Had ongoing drops in oxygen saturation with mobility, down to 86%, she was maintained on oxygen with  activity at 3 liters and continued aggressive pulmonary toilet.  Therapies recommended 24h assistance. The patient and family wished to return home to [place].  We recommended she stay in town for about a week until her pain improves, however, they wished to forgo our recommendations and return home.  We therefore arranged home oxygen with [facility] which is also located about 15 miles from their home.  Yvette Kelly and family understand follow up with PCP is needed to re-assess oxygen needs and for a referral to orthopedic surgery for scapular fracture in 2 weeks.     Discharge Instructions     Medication List    TAKE these medications        acetaminophen 325 MG tablet  Commonly known as:  TYLENOL  Take 1-2 tablets (325-650 mg total) by mouth every 6 (six) hours as needed for fever, headache, mild pain or moderate pain.     aspirin EC 81 MG tablet  Take 81 mg by mouth daily.     diltiazem 300 MG 24 hr capsule  Commonly known as:  TIAZAC  Take 300 mg by mouth daily.     docusate sodium 100 MG capsule  Commonly known as:  COLACE  Take 1 capsule (100 mg total) by mouth 2 (two) times daily.     furosemide 40 MG tablet  Commonly known as:  LASIX  Take 40 mg by mouth daily.     ondansetron 4 MG disintegrating tablet  Commonly known as:  ZOFRAN ODT  Take 1 tablet (4 mg total) by mouth every 8 (eight) hours as needed for nausea or vomiting.     polyethylene glycol packet  Commonly known as:  MIRALAX / GLYCOLAX  Take 17 g by mouth daily.     simvastatin 20 MG tablet  Commonly known as:  ZOCOR  Take 20 mg by mouth daily.     traMADol 50 MG tablet  Commonly known as:  ULTRAM  Take 1 tablet (50 mg total) by mouth every 6 (six) hours as needed (50mg  for mild pain, 75mg  for moderate pain, 100mg  for severe pain).           Follow-up Information    Follow up with [facility].   Why:  As needed   Contact information:   [place] [phone]      Follow up with primary care doctor.   Why:  hospitalization and to recheck oxygen need       The results of significant diagnostics from this hospitalization (including imaging, microbiology, ancillary and laboratory) are listed below for reference.    Significant Diagnostic Studies: Dg Chest 2 View  08/19/2015  CLINICAL DATA:  Multiple left rib fractures, small medial left pneumothorax demonstrated on CT scan yesterday. EXAM: CHEST  2 VIEW COMPARISON:  Chest CT scan and chest x-ray of August 18, 2015 FINDINGS: A faint pleural line is observed in the left apex consistent with a less than 10% pneumothorax multiple left upper rib fractures are observed. There is small left pleural effusion. The right lung is well-expanded and clear. There is apical pleural thickening on the right. The cardiac silhouette is enlarged. The pulmonary vascularity is normal. There is mild tortuosity of the descending thoracic aorta. There are surgical clips in the left axillary region. A lucency through the scapular body is consistent with known scapular fracture. IMPRESSION: 1. 10% or less left-sided apical pneumothorax. Mild cardiomegaly with small left pleural effusion. 2. Left scapular body fracture and fractures of the lateral aspects of left second through seventh ribs. Electronically Signed   By: David  Martinique M.D.   On: 08/19/2015 08:12   Ct Head Wo Contrast  08/18/2015  CLINICAL DATA:  Level 2 trauma. Patient fell down 10 steps. Concern for head, maxillofacial or cervical spine injury. Initial encounter. EXAM: CT HEAD WITHOUT CONTRAST CT MAXILLOFACIAL WITHOUT CONTRAST CT CERVICAL SPINE WITHOUT CONTRAST TECHNIQUE: Multidetector CT imaging of the head, cervical spine, and maxillofacial structures were performed using the standard protocol without intravenous contrast. Multiplanar CT image reconstructions of the cervical spine and maxillofacial structures were also generated.  COMPARISON:  None. FINDINGS: CT HEAD FINDINGS There is no evidence of acute infarction, mass lesion, or intra- or extra-axial hemorrhage on CT. Scattered periventricular and subcortical white matter change likely reflects small vessel ischemic microangiopathy. Mild cerebellar atrophy is noted. A tiny focus of increased attenuation at the high left posterior parietal lobe is thought to reflect a vessel. The brainstem and fourth ventricle are within normal limits. The third and lateral ventricles, and basal ganglia are unremarkable in appearance. The cerebral hemispheres are symmetric in appearance, with normal gray-white differentiation. No mass effect or midline shift is seen. There is no evidence of fracture; visualized osseous structures are unremarkable in appearance. The orbits are within normal limits. The paranasal sinuses and mastoid air cells are well-aerated. Soft tissue swelling is noted overlying the left posterior parietal calvarium. CT MAXILLOFACIAL FINDINGS There appears to be a small  fracture extending across the anterior aspect of the roots of the left central and lateral maxillary incisors. There is no additional evidence of fracture. The mandible appears intact. The nasal bone is unremarkable in appearance. Degenerative change is noted at the temporomandibular joints bilaterally, with flattening of the mandibular condylar heads. A periapical abscess is noted at the root of the right lateral maxillary incisor. The orbits are intact bilaterally. The visualized paranasal sinuses and mastoid air cells are well-aerated. No significant soft tissue abnormalities are seen. The parapharyngeal fat planes are preserved. The nasopharynx, oropharynx and hypopharynx are unremarkable in appearance. The visualized portions of the valleculae and piriform sinuses are grossly unremarkable. The parotid and submandibular glands are within normal limits. No cervical lymphadenopathy is seen. CT CERVICAL SPINE FINDINGS  There appear to be nondisplaced fractures through the left transverse processes of T2 and T3. No additional fractures are seen. There is minimal grade 1 anterolisthesis of C4 on C5. Multilevel disc space narrowing is noted along the cervical spine, with scattered anterior and posterior disc osteophyte complexes, and underlying facet disease. Vertebral bodies demonstrate normal height. Prevertebral soft tissues are within normal limits. Tiny hypodensities within the thyroid gland are likely benign, given their size. Scarring is noted at the lung apices. A tiny left apical pneumothorax is noted. Mild scattered soft tissue air is seen posterior to the left lung apex. IMPRESSION: 1. No evidence of traumatic intracranial injury. 2. Small fracture extending across the anterior aspect of the roots of the left central and lateral maxillary incisors. 3. Nondisplaced fractures through the left transverse processes of T2 and T3. 4. Soft tissue swelling overlying the left posterior parietal calvarium. 5. Scattered small vessel ischemic microangiopathy. 6. Degenerative change at the temporomandibular joints bilaterally, with flattening of the mandibular condylar heads. 7. Periapical abscess at the root of the right lateral maxillary incisor. 8. Degenerative change along the cervical spine. 9. Scarring at the lung apices. Tiny left apical pneumothorax noted. 10. Mild scattered soft tissue air posterior to the left lung apex. These results were called by telephone at the time of interpretation on 08/18/2015 at 3:46 am to Dr. Davonna Belling, who verbally acknowledged these results. Electronically Signed   By: Garald Balding M.D.   On: 08/18/2015 03:47   Ct Chest W Contrast  08/18/2015  CLINICAL DATA:  Level 2 trauma.  Fall down steps. Initial encounter. EXAM: CT CHEST, ABDOMEN, AND PELVIS WITH CONTRAST TECHNIQUE: Multidetector CT imaging of the chest, abdomen and pelvis was performed following the standard protocol during bolus  administration of intravenous contrast. CONTRAST:  15mL ISOVUE-300 IOPAMIDOL (ISOVUE-300) INJECTION 61% COMPARISON:  None. FINDINGS: CT CHEST FINDINGS THORACIC INLET/BODY WALL: Soft tissue gas posterior to the left upper chest and around the left shoulder girdle, presumably from pulmonic air leak as no laceration is seen. Left mastectomy.  No axillary adenopathy. Sub cm right thyroid nodule. MEDIASTINUM: Mild cardiomegaly. No pericardial effusion. No mediastinal hematoma or evidence of acute vascular injury. Small sliding hiatal hernia. LUNG WINDOWS: Small left pneumothorax, less than 5%. Minor lung contusion peripherally in the left lower lobe. Patchy bilateral atelectasis. When accounting for extrapleural hemorrhage there is no hemothorax. OSSEOUS: See below CT ABDOMEN AND PELVIS FINDINGS BODY WALL: Subcutaneous contusion around the left flank. Hepatobiliary: No focal liver abnormality.No evidence of biliary obstruction or stone. Pancreas: Unremarkable. Spleen: Unremarkable. Adrenals/Urinary Tract: Negative adrenals. No evidence of renal injury. Unremarkable bladder. Reproductive:Abnormally thickened endometrium at 22 mm. Stomach/Bowel: No evidence of injury. Distal colonic diverticulosis. Vascular/Lymphatic: No acute  vascular abnormality. No mass or adenopathy. Peritoneal: No ascites or pneumoperitoneum. Musculoskeletal: Comminuted fracture of the medial scapula on the left with multiple displaced and rotated fragments. Left second through seventh rib fractures with up to moderate displacement. There is extrapleural hemorrhage and gas. Chronic appearing T6 and T9 superior endplate fractures. T10 vertebral body hemangioma. T2 and T3 left transverse process fractures, nondisplaced and better seen on cervical spine CT - included for continuity. Lumbar predominant disc degeneration and facet arthropathy. Critical Value/emergent results were called by telephone at the time of interpretation on 08/18/2015 at 4:11 am to  Dr. Davonna Belling , who verbally acknowledged these results. IMPRESSION: 1. Small left pneumothorax, less than 5%. Minor left lung contusion. 2. Left second through seventh rib fractures with up to moderate displacement. 3. T2 and T3 left transverse process fractures, nondisplaced. 4. Comminuted and displaced left medial scapula fracture. 5. Left flank contusion.  No evidence of intra-abdominal injury. 6. Abnormally thickened endometrium at 22 mm, possible carcinoma. Recommend follow-up pelvic ultrasound after convalescence. Electronically Signed   By: Monte Fantasia M.D.   On: 08/18/2015 04:12   Ct Cervical Spine Wo Contrast  08/18/2015  CLINICAL DATA:  Level 2 trauma. Patient fell down 10 steps. Concern for head, maxillofacial or cervical spine injury. Initial encounter. EXAM: CT HEAD WITHOUT CONTRAST CT MAXILLOFACIAL WITHOUT CONTRAST CT CERVICAL SPINE WITHOUT CONTRAST TECHNIQUE: Multidetector CT imaging of the head, cervical spine, and maxillofacial structures were performed using the standard protocol without intravenous contrast. Multiplanar CT image reconstructions of the cervical spine and maxillofacial structures were also generated. COMPARISON:  None. FINDINGS: CT HEAD FINDINGS There is no evidence of acute infarction, mass lesion, or intra- or extra-axial hemorrhage on CT. Scattered periventricular and subcortical white matter change likely reflects small vessel ischemic microangiopathy. Mild cerebellar atrophy is noted. A tiny focus of increased attenuation at the high left posterior parietal lobe is thought to reflect a vessel. The brainstem and fourth ventricle are within normal limits. The third and lateral ventricles, and basal ganglia are unremarkable in appearance. The cerebral hemispheres are symmetric in appearance, with normal gray-white differentiation. No mass effect or midline shift is seen. There is no evidence of fracture; visualized osseous structures are unremarkable in appearance.  The orbits are within normal limits. The paranasal sinuses and mastoid air cells are well-aerated. Soft tissue swelling is noted overlying the left posterior parietal calvarium. CT MAXILLOFACIAL FINDINGS There appears to be a small fracture extending across the anterior aspect of the roots of the left central and lateral maxillary incisors. There is no additional evidence of fracture. The mandible appears intact. The nasal bone is unremarkable in appearance. Degenerative change is noted at the temporomandibular joints bilaterally, with flattening of the mandibular condylar heads. A periapical abscess is noted at the root of the right lateral maxillary incisor. The orbits are intact bilaterally. The visualized paranasal sinuses and mastoid air cells are well-aerated. No significant soft tissue abnormalities are seen. The parapharyngeal fat planes are preserved. The nasopharynx, oropharynx and hypopharynx are unremarkable in appearance. The visualized portions of the valleculae and piriform sinuses are grossly unremarkable. The parotid and submandibular glands are within normal limits. No cervical lymphadenopathy is seen. CT CERVICAL SPINE FINDINGS There appear to be nondisplaced fractures through the left transverse processes of T2 and T3. No additional fractures are seen. There is minimal grade 1 anterolisthesis of C4 on C5. Multilevel disc space narrowing is noted along the cervical spine, with scattered anterior and posterior disc osteophyte complexes, and  underlying facet disease. Vertebral bodies demonstrate normal height. Prevertebral soft tissues are within normal limits. Tiny hypodensities within the thyroid gland are likely benign, given their size. Scarring is noted at the lung apices. A tiny left apical pneumothorax is noted. Mild scattered soft tissue air is seen posterior to the left lung apex. IMPRESSION: 1. No evidence of traumatic intracranial injury. 2. Small fracture extending across the anterior  aspect of the roots of the left central and lateral maxillary incisors. 3. Nondisplaced fractures through the left transverse processes of T2 and T3. 4. Soft tissue swelling overlying the left posterior parietal calvarium. 5. Scattered small vessel ischemic microangiopathy. 6. Degenerative change at the temporomandibular joints bilaterally, with flattening of the mandibular condylar heads. 7. Periapical abscess at the root of the right lateral maxillary incisor. 8. Degenerative change along the cervical spine. 9. Scarring at the lung apices. Tiny left apical pneumothorax noted. 10. Mild scattered soft tissue air posterior to the left lung apex. These results were called by telephone at the time of interpretation on 08/18/2015 at 3:46 am to Dr. Davonna Belling, who verbally acknowledged these results. Electronically Signed   By: Garald Balding M.D.   On: 08/18/2015 03:47   Ct Abdomen Pelvis W Contrast  08/18/2015  CLINICAL DATA:  Level 2 trauma.  Fall down steps. Initial encounter. EXAM: CT CHEST, ABDOMEN, AND PELVIS WITH CONTRAST TECHNIQUE: Multidetector CT imaging of the chest, abdomen and pelvis was performed following the standard protocol during bolus administration of intravenous contrast. CONTRAST:  12mL ISOVUE-300 IOPAMIDOL (ISOVUE-300) INJECTION 61% COMPARISON:  None. FINDINGS: CT CHEST FINDINGS THORACIC INLET/BODY WALL: Soft tissue gas posterior to the left upper chest and around the left shoulder girdle, presumably from pulmonic air leak as no laceration is seen. Left mastectomy.  No axillary adenopathy. Sub cm right thyroid nodule. MEDIASTINUM: Mild cardiomegaly. No pericardial effusion. No mediastinal hematoma or evidence of acute vascular injury. Small sliding hiatal hernia. LUNG WINDOWS: Small left pneumothorax, less than 5%. Minor lung contusion peripherally in the left lower lobe. Patchy bilateral atelectasis. When accounting for extrapleural hemorrhage there is no hemothorax. OSSEOUS: See below CT  ABDOMEN AND PELVIS FINDINGS BODY WALL: Subcutaneous contusion around the left flank. Hepatobiliary: No focal liver abnormality.No evidence of biliary obstruction or stone. Pancreas: Unremarkable. Spleen: Unremarkable. Adrenals/Urinary Tract: Negative adrenals. No evidence of renal injury. Unremarkable bladder. Reproductive:Abnormally thickened endometrium at 22 mm. Stomach/Bowel: No evidence of injury. Distal colonic diverticulosis. Vascular/Lymphatic: No acute vascular abnormality. No mass or adenopathy. Peritoneal: No ascites or pneumoperitoneum. Musculoskeletal: Comminuted fracture of the medial scapula on the left with multiple displaced and rotated fragments. Left second through seventh rib fractures with up to moderate displacement. There is extrapleural hemorrhage and gas. Chronic appearing T6 and T9 superior endplate fractures. T10 vertebral body hemangioma. T2 and T3 left transverse process fractures, nondisplaced and better seen on cervical spine CT - included for continuity. Lumbar predominant disc degeneration and facet arthropathy. Critical Value/emergent results were called by telephone at the time of interpretation on 08/18/2015 at 4:11 am to Dr. Davonna Belling , who verbally acknowledged these results. IMPRESSION: 1. Small left pneumothorax, less than 5%. Minor left lung contusion. 2. Left second through seventh rib fractures with up to moderate displacement. 3. T2 and T3 left transverse process fractures, nondisplaced. 4. Comminuted and displaced left medial scapula fracture. 5. Left flank contusion.  No evidence of intra-abdominal injury. 6. Abnormally thickened endometrium at 22 mm, possible carcinoma. Recommend follow-up pelvic ultrasound after convalescence. Electronically Signed   By:  Monte Fantasia M.D.   On: 08/18/2015 04:12   Dg Pelvis Portable  08/18/2015  CLINICAL DATA:  Status post fall, with concern for pelvic injury. Initial encounter. EXAM: PORTABLE PELVIS 1-2 VIEWS COMPARISON:   None. FINDINGS: There is no evidence of fracture or dislocation. Both femoral heads are seated normally within their respective acetabula. Mild degenerative change is noted at the lower lumbar spine. The sacroiliac joints are unremarkable in appearance. The visualized bowel gas pattern is grossly unremarkable in appearance. IMPRESSION: No evidence of fracture or dislocation. Electronically Signed   By: Garald Balding M.D.   On: 08/18/2015 02:56   Dg Chest Portable 1 View  08/18/2015  CLINICAL DATA:  Status post fall, with concern for chest injury. Initial encounter. EXAM: PORTABLE CHEST 1 VIEW COMPARISON:  None. FINDINGS: The lungs are well-aerated. A small left pleural effusion is noted. Mild vascular congestion is seen. Mild bibasilar atelectasis is noted. There is no evidence of pleural effusion or pneumothorax. The cardiomediastinal silhouette is within normal limits. No acute osseous abnormalities are seen. Scattered clips are seen overlying the left axilla. IMPRESSION: Small left pleural effusion noted. Mild vascular congestion seen. Mild bibasilar atelectasis noted. Electronically Signed   By: Garald Balding M.D.   On: 08/18/2015 02:56   Dg Shoulder Left  08/18/2015  CLINICAL DATA:  Status post fall, with left shoulder pain. Initial encounter. EXAM: LEFT SHOULDER - 2+ VIEW COMPARISON:  None. FINDINGS: There is a displaced and angulated fracture involving the body of the scapula. Overlying soft tissue air and soft tissue hemorrhage are suggested. There appears to be extension into the superior scapula, though this is not well characterized. There appears to be anterior subluxation of the left humeral head, with anterior narrowing of the glenohumeral joint, thought to reflect underlying ligamentous laxity and chronic degenerative change. No definite dislocation is seen. The proximal left humerus remains intact. The left acromioclavicular joint is grossly unremarkable. The visualized portions of lungs are  grossly clear. IMPRESSION: 1. Displaced and angulated fracture involving the body of the scapula, with overlying soft tissue air and suggestion of soft tissue hemorrhage. There appears to be extension into the superior scapula, not fully characterized. 2. Anterior subluxation of the left humeral head, with anterior narrowing of the glenohumeral joint, thought to reflect underlying ligamentous laxity and chronic degenerative change. Electronically Signed   By: Garald Balding M.D.   On: 08/18/2015 02:59   Ct Maxillofacial Wo Cm  08/18/2015  CLINICAL DATA:  Level 2 trauma. Patient fell down 10 steps. Concern for head, maxillofacial or cervical spine injury. Initial encounter. EXAM: CT HEAD WITHOUT CONTRAST CT MAXILLOFACIAL WITHOUT CONTRAST CT CERVICAL SPINE WITHOUT CONTRAST TECHNIQUE: Multidetector CT imaging of the head, cervical spine, and maxillofacial structures were performed using the standard protocol without intravenous contrast. Multiplanar CT image reconstructions of the cervical spine and maxillofacial structures were also generated. COMPARISON:  None. FINDINGS: CT HEAD FINDINGS There is no evidence of acute infarction, mass lesion, or intra- or extra-axial hemorrhage on CT. Scattered periventricular and subcortical white matter change likely reflects small vessel ischemic microangiopathy. Mild cerebellar atrophy is noted. A tiny focus of increased attenuation at the high left posterior parietal lobe is thought to reflect a vessel. The brainstem and fourth ventricle are within normal limits. The third and lateral ventricles, and basal ganglia are unremarkable in appearance. The cerebral hemispheres are symmetric in appearance, with normal gray-white differentiation. No mass effect or midline shift is seen. There is no evidence of fracture; visualized  osseous structures are unremarkable in appearance. The orbits are within normal limits. The paranasal sinuses and mastoid air cells are well-aerated. Soft  tissue swelling is noted overlying the left posterior parietal calvarium. CT MAXILLOFACIAL FINDINGS There appears to be a small fracture extending across the anterior aspect of the roots of the left central and lateral maxillary incisors. There is no additional evidence of fracture. The mandible appears intact. The nasal bone is unremarkable in appearance. Degenerative change is noted at the temporomandibular joints bilaterally, with flattening of the mandibular condylar heads. A periapical abscess is noted at the root of the right lateral maxillary incisor. The orbits are intact bilaterally. The visualized paranasal sinuses and mastoid air cells are well-aerated. No significant soft tissue abnormalities are seen. The parapharyngeal fat planes are preserved. The nasopharynx, oropharynx and hypopharynx are unremarkable in appearance. The visualized portions of the valleculae and piriform sinuses are grossly unremarkable. The parotid and submandibular glands are within normal limits. No cervical lymphadenopathy is seen. CT CERVICAL SPINE FINDINGS There appear to be nondisplaced fractures through the left transverse processes of T2 and T3. No additional fractures are seen. There is minimal grade 1 anterolisthesis of C4 on C5. Multilevel disc space narrowing is noted along the cervical spine, with scattered anterior and posterior disc osteophyte complexes, and underlying facet disease. Vertebral bodies demonstrate normal height. Prevertebral soft tissues are within normal limits. Tiny hypodensities within the thyroid gland are likely benign, given their size. Scarring is noted at the lung apices. A tiny left apical pneumothorax is noted. Mild scattered soft tissue air is seen posterior to the left lung apex. IMPRESSION: 1. No evidence of traumatic intracranial injury. 2. Small fracture extending across the anterior aspect of the roots of the left central and lateral maxillary incisors. 3. Nondisplaced fractures through  the left transverse processes of T2 and T3. 4. Soft tissue swelling overlying the left posterior parietal calvarium. 5. Scattered small vessel ischemic microangiopathy. 6. Degenerative change at the temporomandibular joints bilaterally, with flattening of the mandibular condylar heads. 7. Periapical abscess at the root of the right lateral maxillary incisor. 8. Degenerative change along the cervical spine. 9. Scarring at the lung apices. Tiny left apical pneumothorax noted. 10. Mild scattered soft tissue air posterior to the left lung apex. These results were called by telephone at the time of interpretation on 08/18/2015 at 3:46 am to Dr. Davonna Belling, who verbally acknowledged these results. Electronically Signed   By: Garald Balding M.D.   On: 08/18/2015 03:47    Microbiology: No results found for this or any previous visit (from the past 240 hour(s)).   Labs: Basic Metabolic Panel:  Recent Labs Lab 08/18/15 0202 08/19/15 0731 08/19/15 1222 08/20/15 1016  NA 142 142 140 138  K 4.1 4.7 4.4 4.6  CL 103 108 107 107  CO2  --  24 27 24   GLUCOSE 131* 111* 105* 100*  BUN 34* 25* 23* 18  CREATININE 1.20* 1.25* 1.22* 1.06*  CALCIUM  --  8.9 8.5* 8.5*   Liver Function Tests: No results for input(s): AST, ALT, ALKPHOS, BILITOT, PROT, ALBUMIN in the last 168 hours. No results for input(s): LIPASE, AMYLASE in the last 168 hours. No results for input(s): AMMONIA in the last 168 hours. CBC:  Recent Labs Lab 08/18/15 0145 08/18/15 0202 08/19/15 0731  WBC 15.9*  --  12.4*  NEUTROABS 11.2*  --   --   HGB 12.5 12.9 10.4*  HCT 38.2 38.0 31.9*  MCV 94.1  --  93.8  PLT 205  --  173   Cardiac Enzymes: No results for input(s): CKTOTAL, CKMB, CKMBINDEX, TROPONINI in the last 168 hours. BNP: BNP (last 3 results) No results for input(s): BNP in the last 8760 hours.  ProBNP (last 3 results) No results for input(s): PROBNP in the last 8760 hours.  CBG: No results for input(s): GLUCAP in  the last 168 hours.  Active Problems:   Multiple rib fractures   Time coordinating discharge: <30 mins   Signed:  Skai Lickteig, ANP-BC

## 2015-08-21 NOTE — Progress Notes (Signed)
SATURATION QUALIFICATIONS: (This note is used to comply with regulatory documentation for home oxygen)  Patient Saturations on Room Air at Rest = 88%  Patient Saturations on Room Air while Ambulating = 85%  Patient Saturations on 3 Liters of oxygen while Ambulating = 94%  Please briefly explain why patient needs home oxygen: Pt remains to desaturate on RA.    Governor Rooks, PTA pager [phone]

## 2015-08-21 NOTE — Progress Notes (Signed)
Physical Therapy Treatment Patient Details Name: Yvette Kelly MRN: [id number] DOB: 1925-08-08 Today's Date: 08/21/2015    History of Present Illness Pt is a 82 y/o F from [place] visiting her granddaughter in [place] when she got up at night to use the restroom and fell down a flight of steps.  Pt now w/ resultant Lt scapular fx, Lt 2-7 rib fxs, and broken teeth.     PT Comments    Pt performed short distance to toilet without O2 on RA and desaturated to 85%.  Pt required 3L O2 to maintain O2 sats greater than 90%.  Pt performed increased gait with c/o nausea at end of trial and + emesis.  RN informed and administered nausea meds.    Follow Up Recommendations  Home health PT     Equipment Recommendations  Other (comment) (hemiwalker )    Recommendations for Other Services OT consult     Precautions / Restrictions Precautions Precautions: Fall Required Braces or Orthoses: Sling Restrictions Weight Bearing Restrictions: Yes LUE Weight Bearing: Non weight bearing    Mobility  Bed Mobility Overal bed mobility: Needs Assistance Bed Mobility: Rolling Rolling: Supervision         General bed mobility comments: Pt performed with increased time.    Transfers Overall transfer level: Needs assistance Equipment used: Hemi-walker Transfers: Sit to/from Stand Sit to Stand: Supervision Stand pivot transfers: Supervision       General transfer comment: Cues for hand placement to avoid pulling on hemiwalker and maintain balance.    Ambulation/Gait Ambulation/Gait assistance: Min guard;Supervision Ambulation Distance (Feet): 250 Feet Assistive device: Hemi-walker Gait Pattern/deviations: Step-to pattern;Decreased stride length;Antalgic;Shuffle;Trunk flexed   Gait velocity interpretation: Below normal speed for age/gender General Gait Details: Cues for increased B stride and foot clearance.  Pt required decreased assistance with hemi walker.  Pt required encouragement to  advance gait distance.  Post gait training c/o nausea and + emesis.   2 seated rest breaks.  Required 3L to maintain O2 saturations greater than 90%.    Stairs            Wheelchair Mobility    Modified Rankin (Stroke Patients Only)       Balance Overall balance assessment: Needs assistance   Sitting balance-Leahy Scale: Good       Standing balance-Leahy Scale: Fair                      Cognition Arousal/Alertness: Awake/alert Behavior During Therapy: WFL for tasks assessed/performed Overall Cognitive Status: Within Functional Limits for tasks assessed                      Exercises      General Comments        Pertinent Vitals/Pain Pain Assessment: 0-10 Pain Score: 7  Faces Pain Scale: Hurts even more Pain Location: L shoudler/ rib cage Pain Descriptors / Indicators: Grimacing Pain Intervention(s): Monitored during session;Repositioned    Home Living                      Prior Function            PT Goals (current goals can now be found in the care plan section) Acute Rehab PT Goals Patient Stated Goal: get better, be independent  Potential to Achieve Goals: Good Progress towards PT goals: Progressing toward goals    Frequency  Min 3X/week    PT Plan Current plan remains appropriate  Co-evaluation             End of Session Equipment Utilized During Treatment:  (no gt belt due to rib fractures.  ) Activity Tolerance: Patient tolerated treatment well Patient left: in chair;with call bell/phone within reach;with family/visitor present     Time: [id number] PT Time Calculation (min) (ACUTE ONLY): 47 min  Charges:  $Gait Training: 23-37 mins $Therapeutic Activity: 8-22 mins                    G Codes:      Cristela Blue 09/01/15, 1:08 PM  Governor Rooks, PTA pager [phone]

## 2015-08-21 NOTE — Progress Notes (Signed)
Pt discharged. IV removed. CD brought up to family as requested. O2 tank delivered. Pt. Is alert and oriented. Pt is hemodynamically stable. AVS reviewed with pt. Capable of re verbalizing medication regimen. Discharge plan appropriate and in place.

## 2015-08-21 NOTE — Discharge Instructions (Signed)
Fall Prevention in Hospitals, Adult As a hospital patient, your condition and the treatments you receive can increase your risk for falls. Some additional risk factors for falls in a hospital include:  Being in an unfamiliar environment.  Being on bed rest.  Your surgery.  Taking certain medicines.  Your tubing requirements, such as intravenous (IV) therapy or catheters. It is important that you learn how to decrease fall risks while at the hospital. Below are important tips that can help prevent falls. SAFETY TIPS FOR PREVENTING FALLS Talk about your risk of falling.  Ask your health care provider why you are at risk for falling. Is it your medicine, illness, tubing placement, or something else?  Make a plan with your health care provider to keep you safe from falls.  Ask your health care provider or pharmacist about side effects of your medicines. Some medicines can make you dizzy or affect your coordination. Ask for help.  Ask for help before getting out of bed. You may need to press your call button.  Ask for assistance in getting safely to the toilet.  Ask for a walker or cane to be put at your bedside. Ask that most of the side rails on your bed be placed up before your health care provider leaves the room.  Ask family or friends to sit with you.  Ask for things that are out of your reach, such as your glasses, hearing aids, telephone, bedside table, or call button. Follow these tips to avoid falling:  Stay lying or seated, rather than standing, while waiting for help.  Wear rubber-soled slippers or shoes whenever you walk in the hospital.  Avoid quick, sudden movements.  Change positions slowly.  Sit on the side of your bed before standing.  Stand up slowly and wait before you start to walk.  Let your health care provider know if there is a spill on the floor.  Pay careful attention to the medical equipment, electrical cords, and tubes around you.  When you  need help, use your call button by your bed or in the bathroom. Wait for one of your health care providers to help you.  If you feel dizzy or unsure of your footing, return to bed and wait for assistance.  Avoid being distracted by the TV, telephone, or another person in your room.  Do not lean or support yourself on rolling objects, such as IV poles or bedside tables.   This information is not intended to replace advice given to you by your health care provider. Make sure you discuss any questions you have with your health care provider.   Document Released: 02/28/2000 Document Revised: 03/23/2014 Document Reviewed: 11/08/2011 Elsevier Interactive Patient Education 2016 [place]. Rib Fracture A rib fracture is a break or crack in one of the bones of the ribs. The ribs are a group of long, curved bones that wrap around your chest and attach to your spine. They protect your lungs and other organs in the chest cavity. A broken or cracked rib is often painful, but most do not cause other problems. Most rib fractures heal on their own over time. However, rib fractures can be more serious if multiple ribs are broken or if broken ribs move out of place and push against other structures. CAUSES   A direct blow to the chest. For example, this could happen during contact sports, a car accident, or a fall against a hard object.  Repetitive movements with high force, such as pitching  a baseball or having severe coughing spells. SYMPTOMS   Pain when you breathe in or cough.  Pain when someone presses on the injured area. DIAGNOSIS  Your caregiver will perform a physical exam. Various imaging tests may be ordered to confirm the diagnosis and to look for related injuries. These tests may include a chest X-ray, computed tomography (CT), magnetic resonance imaging (MRI), or a bone scan. TREATMENT  Rib fractures usually heal on their own in 1-3 months. The longer healing period is often associated  with a continued cough or other aggravating activities. During the healing period, pain control is very important. Medication is usually given to control pain. Hospitalization or surgery may be needed for more severe injuries, such as those in which multiple ribs are broken or the ribs have moved out of place.  HOME CARE INSTRUCTIONS   Avoid strenuous activity and any activities or movements that cause pain. Be careful during activities and avoid bumping the injured rib.  Gradually increase activity as directed by your caregiver.  Only take over-the-counter or prescription medications as directed by your caregiver. Do not take other medications without asking your caregiver first.  Apply ice to the injured area for the first 1-2 days after you have been treated or as directed by your caregiver. Applying ice helps to reduce inflammation and pain.  Put ice in a plastic bag.  Place a towel between your skin and the bag.   Leave the ice on for 15-20 minutes at a time, every 2 hours while you are awake.  Perform deep breathing as directed by your caregiver. This will help prevent pneumonia, which is a common complication of a broken rib. Your caregiver may instruct you to:  Take deep breaths several times a day.  Try to cough several times a day, holding a pillow against the injured area.  Use a device called an incentive spirometer to practice deep breathing several times a day.  Drink enough fluids to keep your urine clear or pale yellow. This will help you avoid constipation.   Do not wear a rib belt or binder. These restrict breathing, which can lead to pneumonia.  SEEK IMMEDIATE MEDICAL CARE IF:   You have a fever.   You have difficulty breathing or shortness of breath.   You develop a continual cough, or you cough up thick or bloody sputum.  You feel sick to your stomach (nausea), throw up (vomit), or have abdominal pain.   You have worsening pain not controlled with  medications.  MAKE SURE YOU:  Understand these instructions.  Will watch your condition.  Will get help right away if you are not doing well or get worse.   This information is not intended to replace advice given to you by your health care provider. Make sure you discuss any questions you have with your health care provider.   Document Released: 03/02/2005 Document Revised: 11/02/2012 Document Reviewed: 05/04/2012 Elsevier Interactive Patient Education [employer].

## 2015-08-21 NOTE — Progress Notes (Signed)
   Subjective: Feeling much better but still having pain. Pain is relieved with PO pain medicine. Patient wants to return home to [place], but is nervous about the long drive. Voiced that she will have 24 hr supervision.   Objective: Vital signs in last 24 hours: Temp:  [98.2 F (36.8 C)-98.9 F (37.2 C)] 98.3 F (36.8 C) (06/07 0621) Pulse Rate:  [66-73] 66 (06/07 0621) Resp:  [19] 19 (06/07 0621) BP: (122-129)/(47-50) 129/48 mmHg (06/07 0621) SpO2:  [90 %-94 %] 90 % (06/07 0621) Last BM Date: 08/19/15   Laboratory  CBC  Recent Labs  08/19/15 0731  WBC 12.4*  HGB 10.4*  HCT 31.9*  PLT 173   BMET  Recent Labs  08/19/15 1222 08/20/15 1016  NA 140 138  K 4.4 4.6  CL 107 107  CO2 27 24  GLUCOSE 105* 100*  BUN 23* 18  CREATININE 1.22* 1.06*  CALCIUM 8.5* 8.5*     Physical Exam General: alert and oriented, pleasant. No acute distress. Resp: CTAB, ttp on left side Cardio: RRR, no M/G/R GI: soft, nontender, normal BS   Assessment/Plan: Fall down stairs Left rib fractures x 6, 2-7-pulmonary toilet, pulling 556ml on IS. Requiring 02 with ambulation. Left pulmonary contusion Broken teeth and dislodgement Left apical PTX-follow up CXR stable Left Scapular fx: Keep left UE in sling and non-weight bearing per Dr. Rod Can Elevated Creatinine - Creatinine trending down FEN - regular diet.  VTE - lovenox Dispo - needs 24h assist. Family wants to take back to [place] on discharge, explained that she will need to stop frequently and that this may be painful. HH/PT/OT recommendations will all be forwarded to PCP who will arrange for patient. Discharge today.   Kelly Rayburn PA-S  08/21/2015

## 2015-09-19 NOTE — Progress Notes (Signed)
OT Eval Addendum: late entry for 09-11-2015   Sep 11, 2015 1258  OT G-codes **NOT FOR INPATIENT CLASS**  Functional Assessment Tool Used clinical judgement  Functional Limitation Self care  Self Care Current Status [id number]) CM  Self Care Goal Status [id number]) CL  OT Evaluation  $OT Eval Moderate Complexity 1 Procedure  OT Treatments  $Therapeutic Activity 8-22 mins

## 2017-08-31 IMAGING — CT CT CERVICAL SPINE W/O CM
3 of 12 series · 8 of 33 positions shown, 9 images · non-contrast
Comparison: None.

CLINICAL DATA: Level 2 trauma. Patient fell down 10 steps. Concern
for head, maxillofacial or cervical spine injury. Initial encounter.

EXAM:
CT HEAD WITHOUT CONTRAST
CT MAXILLOFACIAL WITHOUT CONTRAST
CT CERVICAL SPINE WITHOUT CONTRAST
TECHNIQUE: Multidetector CT imaging of the head, cervical spine, and
maxillofacial structures were performed using the standard protocol
without intravenous contrast. Multiplanar CT image reconstructions
of the cervical spine and maxillofacial structures were also
generated.

[Series 301: facial bones, idose (1) · axial · 0.34mm/px · z∈[+174,+256]mm · 3 of 83 slices shown, 4 images]
[im 21/83  soft-tissue]
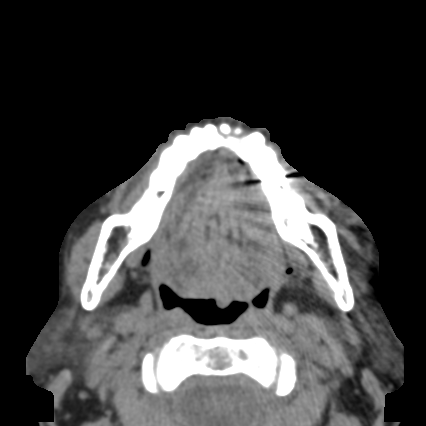
[im 21/83  bone]
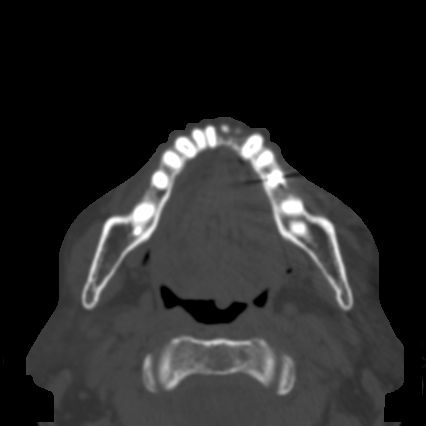
[im 42/83  bone]
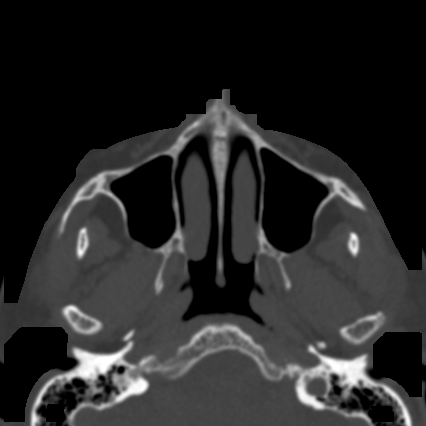
[im 62/83  bone]
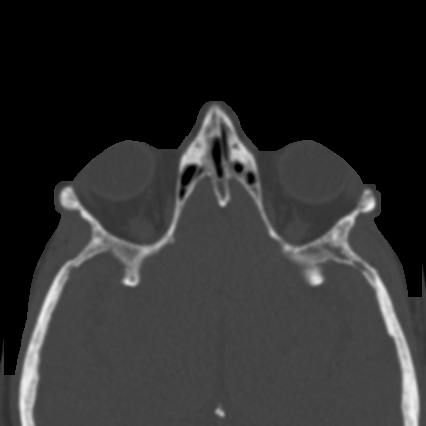

[Series 402: soft tissue, idose (2) · axial · 0.39mm/px · z∈[+112,+200]mm · 3 of 90 slices shown]
[im 23/90  soft-tissue]
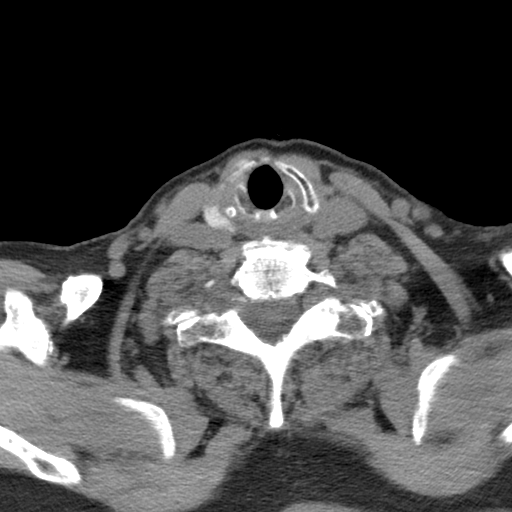
[im 45/90  soft-tissue]
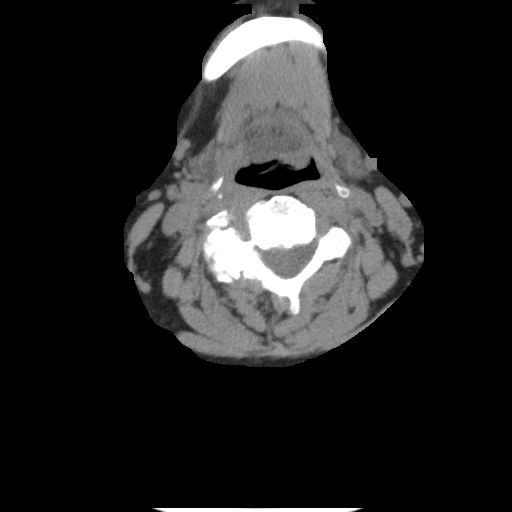
[im 67/90  soft-tissue]
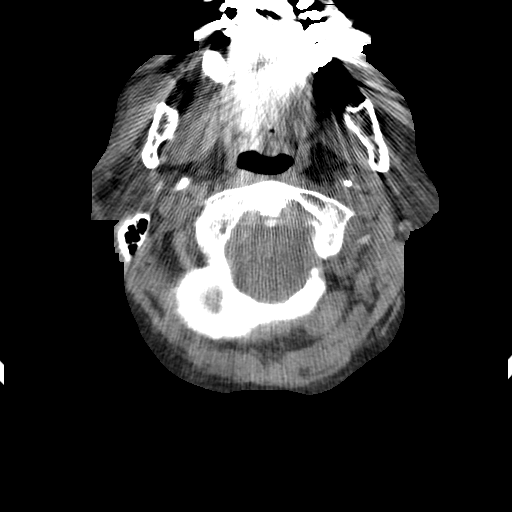

[Series 405: sagittal, idose (2) · sagittal · 0.34mm/px · 2 of 100 slices shown]
[im 34/100  bone]
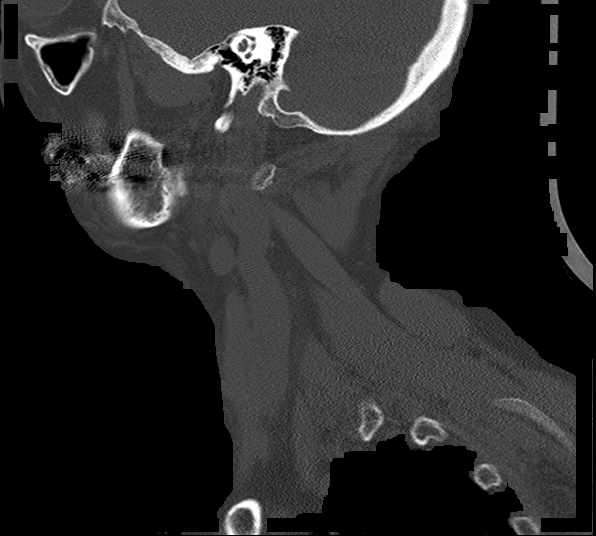
[im 67/100  bone]
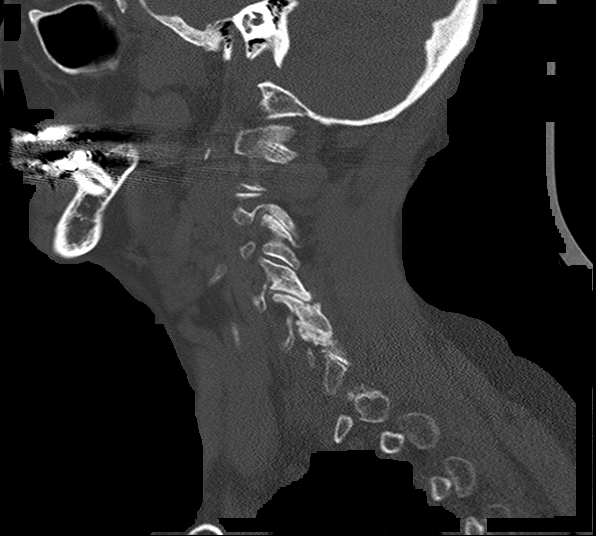

[8 of 33 positions shown; findings below may reference images not displayed]

FINDINGS: CT HEAD FINDINGS

There is no evidence of acute infarction, mass lesion, or intra- or
extra-axial hemorrhage on CT.

Scattered periventricular and subcortical white matter change likely
reflects small vessel ischemic microangiopathy. Mild cerebellar
atrophy is noted. A tiny focus of increased attenuation at the high
left posterior parietal lobe is thought to reflect a vessel.

The brainstem and fourth ventricle are within normal limits. The
third and lateral ventricles, and basal ganglia are unremarkable in
appearance. The cerebral hemispheres are symmetric in appearance,
with normal gray-white differentiation. No mass effect or midline
shift is seen.

There is no evidence of fracture; visualized osseous structures are
unremarkable in appearance. The orbits are within normal limits. The
paranasal sinuses and mastoid air cells are well-aerated. Soft
tissue swelling is noted overlying the left posterior parietal
calvarium.

CT MAXILLOFACIAL FINDINGS

There appears to be a small fracture extending across the anterior
aspect of the roots of the left central and lateral maxillary
incisors. There is no additional evidence of fracture. The mandible
appears intact. The nasal bone is unremarkable in appearance.
Degenerative change is noted at the temporomandibular joints
bilaterally, with flattening of the mandibular condylar heads.

A periapical abscess is noted at the root of the right lateral
maxillary incisor.

The orbits are intact bilaterally. The visualized paranasal sinuses
and mastoid air cells are well-aerated.

No significant soft tissue abnormalities are seen. The
parapharyngeal fat planes are preserved. The nasopharynx, oropharynx
and hypopharynx are unremarkable in appearance. The visualized
portions of the valleculae and piriform sinuses are grossly
unremarkable.

The parotid and submandibular glands are within normal limits. No
cervical lymphadenopathy is seen.

CT CERVICAL SPINE FINDINGS

There appear to be nondisplaced fractures through the left
transverse processes of T2 and T3.

No additional fractures are seen. There is minimal grade 1
anterolisthesis of C4 on C5. Multilevel disc space narrowing is
noted along the cervical spine, with scattered anterior and
posterior disc osteophyte complexes, and underlying facet disease.
Vertebral bodies demonstrate normal height. Prevertebral soft
tissues are within normal limits.

Tiny hypodensities within the thyroid gland are likely benign, given
their size. Scarring is noted at the lung apices. A tiny left apical
pneumothorax is noted. Mild scattered soft tissue air is seen
posterior to the left lung apex.
IMPRESSION: 1. No evidence of traumatic intracranial injury.
2. Small fracture extending across the anterior aspect of the roots
of the left central and lateral maxillary incisors.
3. Nondisplaced fractures through the left transverse processes of
T2 and T3.
4. Soft tissue swelling overlying the left posterior parietal
calvarium.
5. Scattered small vessel ischemic microangiopathy.
6. Degenerative change at the temporomandibular joints bilaterally,
with flattening of the mandibular condylar heads.
7. Periapical abscess at the root of the right lateral maxillary
incisor.
8. Degenerative change along the cervical spine.
9. Scarring at the lung apices. Tiny left apical pneumothorax noted.
10. Mild scattered soft tissue air posterior to the left lung apex.
These results were called by telephone at the time of interpretation
on 08/18/2015 at [DATE] to Dr. SAVIO LOCKLEAR, who verbally
acknowledged these results.
